# Patient Record
Sex: Male | Born: 1965 | Race: White | Hispanic: No | Marital: Single | Smoking: Current some day smoker
Health system: Southern US, Community
[De-identification: ages and names within clinical notes are randomized; demographics above are authoritative.]

## PROBLEM LIST (undated history)

## (undated) DIAGNOSIS — M1990 Unspecified osteoarthritis, unspecified site: Secondary | ICD-10-CM

## (undated) DIAGNOSIS — I10 Essential (primary) hypertension: Secondary | ICD-10-CM

## (undated) DIAGNOSIS — F319 Bipolar disorder, unspecified: Secondary | ICD-10-CM

## (undated) DIAGNOSIS — K219 Gastro-esophageal reflux disease without esophagitis: Secondary | ICD-10-CM

## (undated) DIAGNOSIS — F32A Depression, unspecified: Secondary | ICD-10-CM

## (undated) DIAGNOSIS — H269 Unspecified cataract: Secondary | ICD-10-CM

## (undated) DIAGNOSIS — F329 Major depressive disorder, single episode, unspecified: Secondary | ICD-10-CM

## (undated) DIAGNOSIS — F4310 Post-traumatic stress disorder, unspecified: Secondary | ICD-10-CM

## (undated) DIAGNOSIS — T8859XA Other complications of anesthesia, initial encounter: Secondary | ICD-10-CM

## (undated) DIAGNOSIS — T4145XA Adverse effect of unspecified anesthetic, initial encounter: Secondary | ICD-10-CM

## (undated) HISTORY — PX: FOOT SURGERY: SHX648

---

## 2001-07-23 ENCOUNTER — Emergency Department (HOSPITAL_COMMUNITY): Admission: AC | Admit: 2001-07-23 | Discharge: 2001-07-23 | Payer: Self-pay

## 2004-02-13 ENCOUNTER — Emergency Department (HOSPITAL_COMMUNITY): Admission: EM | Admit: 2004-02-13 | Discharge: 2004-02-13 | Payer: Self-pay | Admitting: Emergency Medicine

## 2006-08-11 ENCOUNTER — Ambulatory Visit: Payer: Self-pay | Admitting: Orthopedic Surgery

## 2006-11-04 HISTORY — PX: KNEE ARTHROPLASTY: SHX992

## 2006-12-01 ENCOUNTER — Inpatient Hospital Stay (HOSPITAL_COMMUNITY): Admission: RE | Admit: 2006-12-01 | Discharge: 2006-12-03 | Payer: Self-pay | Admitting: Orthopedic Surgery

## 2006-12-19 ENCOUNTER — Encounter (HOSPITAL_COMMUNITY): Admission: RE | Admit: 2006-12-19 | Discharge: 2007-01-18 | Payer: Self-pay | Admitting: Orthopedic Surgery

## 2007-01-26 ENCOUNTER — Encounter (HOSPITAL_COMMUNITY): Admission: RE | Admit: 2007-01-26 | Discharge: 2007-02-25 | Payer: Self-pay | Admitting: Orthopedic Surgery

## 2007-03-24 ENCOUNTER — Encounter (HOSPITAL_COMMUNITY): Admission: RE | Admit: 2007-03-24 | Discharge: 2007-04-23 | Payer: Self-pay | Admitting: Orthopedic Surgery

## 2007-11-05 HISTORY — PX: SPERMATOCELECTOMY: SHX2420

## 2007-12-10 IMAGING — CR DG KNEE 1-2V PORT*R*
2 series · 2 of 2 positions shown · non-contrast
Comparison: none

CLINICAL DATA: Post-op for right knee arthroplasty.
 PORTABLE RIGHT KNEE ? 2 VIEW:

[view not recorded (1 of 2)]
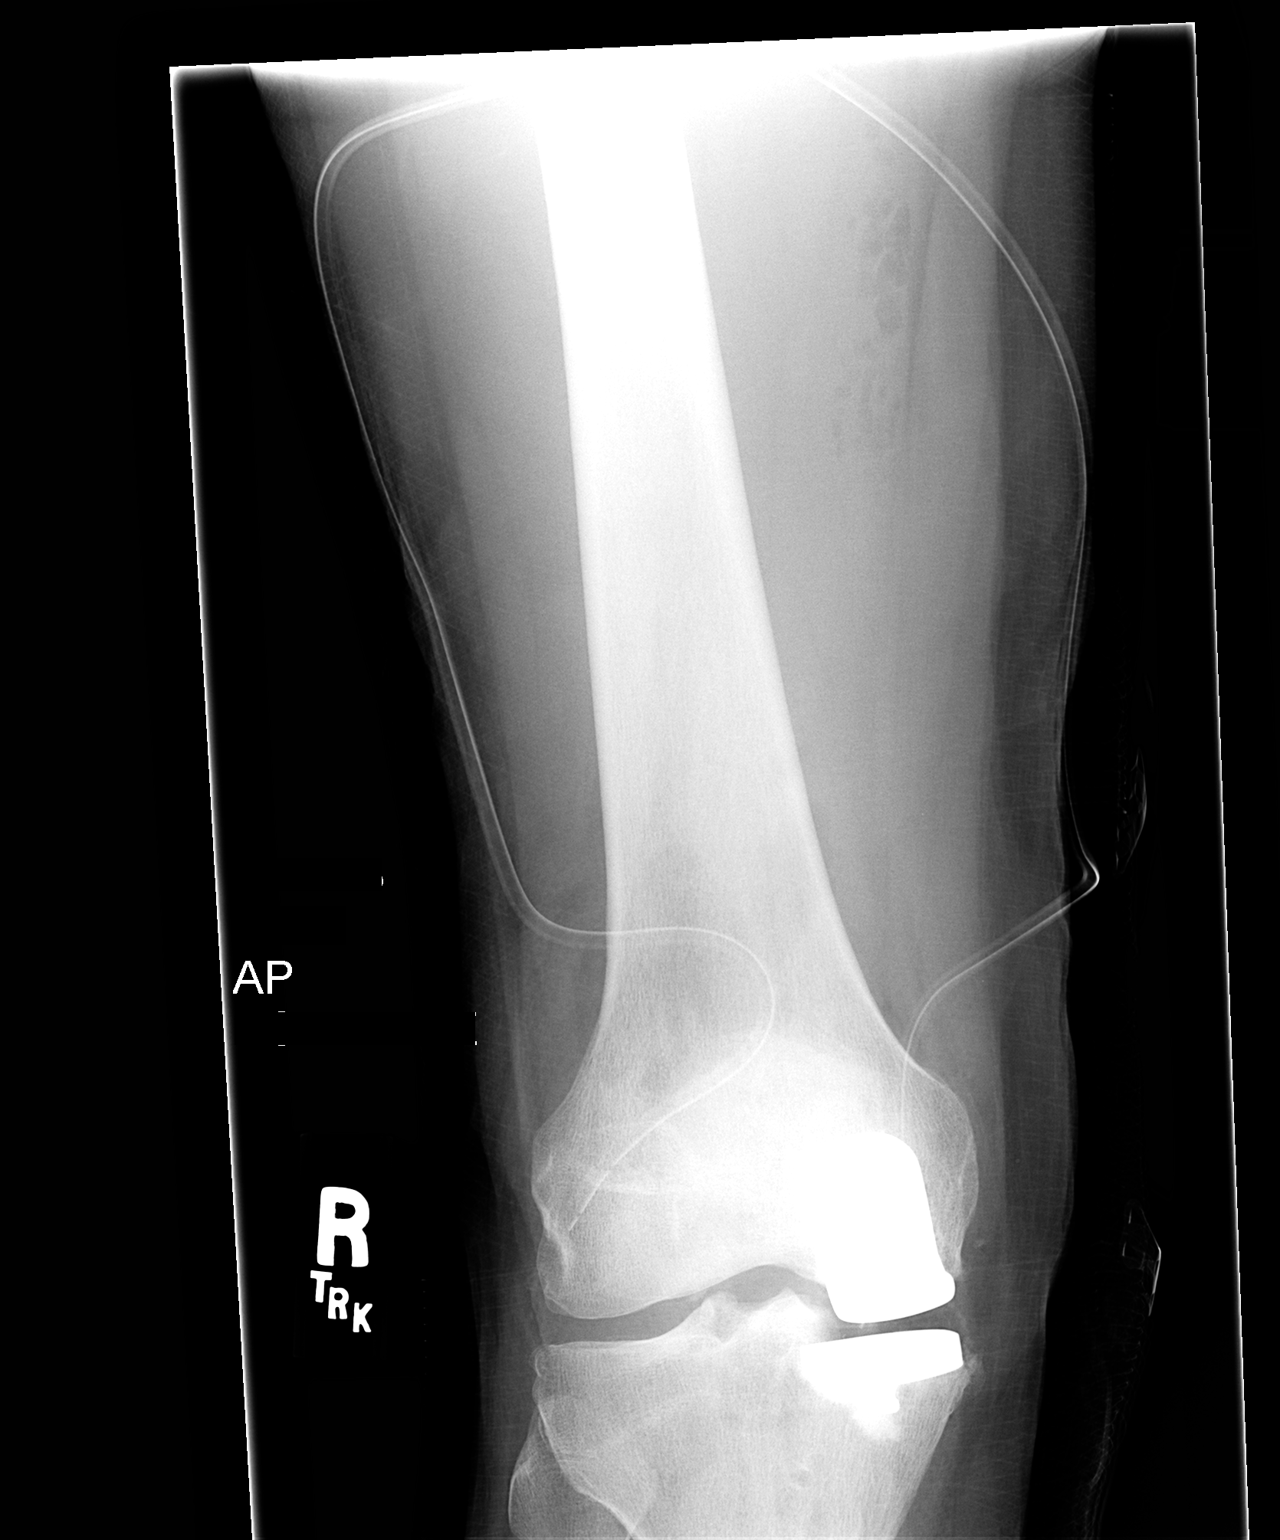

[view not recorded (2 of 2)]
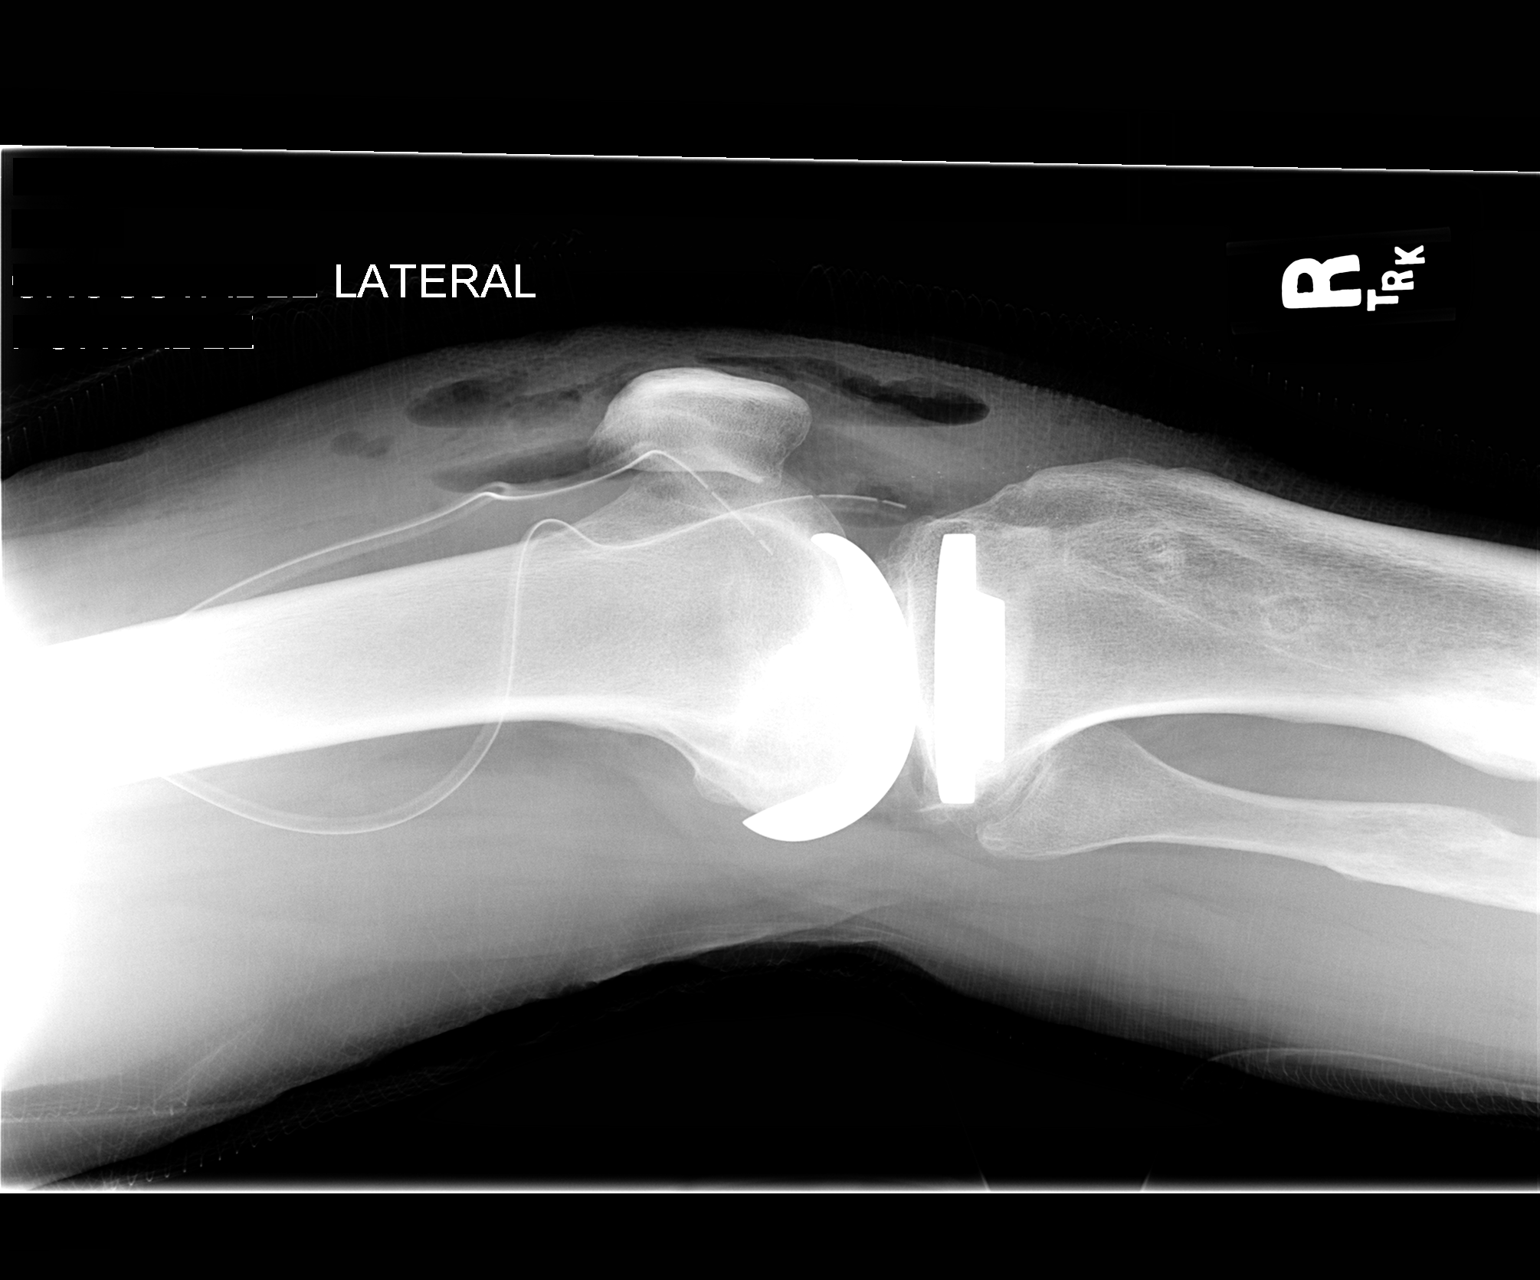

[2 of 2 positions shown; findings below may reference images not displayed]

FINDINGS: The patient is status post uniarthroplasty of the medial compartment with both prosthetic components in expected position.  There is no evidence of fracture or dislocation.  Surgical drains are seen in place and soft tissue gas is noted from recent surgery.
IMPRESSION: Expected postoperative appearance of medial compartment uniarthroplasty.  No evidence of fracture.

## 2008-08-23 ENCOUNTER — Ambulatory Visit (HOSPITAL_COMMUNITY): Admission: RE | Admit: 2008-08-23 | Discharge: 2008-08-23 | Payer: Self-pay | Admitting: Urology

## 2008-08-23 ENCOUNTER — Encounter (INDEPENDENT_AMBULATORY_CARE_PROVIDER_SITE_OTHER): Payer: Self-pay | Admitting: Urology

## 2011-03-19 NOTE — Op Note (Signed)
NAME:  Keith Garza, Keith Garza              ACCOUNT NO.:  [id number]   MEDICAL RECORD NO.:  [id number]          PATIENT TYPE:  AMB   LOCATION:  DAY                           FACILITY:  [facility]   PHYSICIAN:  Ky Barban, M.D.DATE OF BIRTH:  [birth year]   DATE OF PROCEDURE:  DATE OF DISCHARGE:                               OPERATIVE REPORT   PREOPERATIVE DIAGNOSIS:  Left spermatocele.   POSTOPERATIVE DIAGNOSIS:  Left spermatocele.   PROCEDURE:  Excision of left spermatocele.   ANESTHESIA:  General.   PROCEDURE:  The patient is under general anesthesia in supine position  area prepped and draped.  The spermatocele which is the size of a walnut  was held by the assistant.  Skin incision over it was made about an inch  long, carried down through the fascial layers which were divided with  the help of the cautery.  Spermatocele along with the testicle was  delivered through this incision with blunt dissection.  I was able to  dissect the spermatocele completely where it was attached to the area of  the epididymis and I could see the epididymis, started with lot of small  cysts, but it appeared like the area of the epididymis, but the vas  deferens I could not see it is separate from this whole area.  I  separated the vas deferens.  I just could not recognize, I thought it is  just tunica vaginalis which was rolled and feel like a cord, but it  started with those multiple cysts, also the larger cyst was not one,  there were smaller cysts along with it.  So, I had to excise all of them  along with this area which is attached to the upper pole of the  testicle.  It is the area where the epididymis was supposed to be, but I  cannot say 100% it was the epididymis.  So, I decided to remove it  because there are multiple cysts.  I am concerned that he will have a  recurrence of this spermatocele again.  After removing the specimen, the  hemostasis was completely obtained by cauterizing the  bleeders.  The  testicle was placed properly and the scrotal sac and the fascial layer  of the wound was irrigated with saline and fascial layers were closed  with continuous stitch of 3-0 Vicryl.  Skin was closed with 3-0 chromic  horizontal mattress stitches and Betadine ointment applied to the  incision.  A 0.25% Marcaine about 10 mL injected in the incision.  Sterile dressing applied.  The patient left the operating room in  satisfactory condition.      Ky Barban, M.D.  Electronically Signed     MIJ/MEDQ  D:  08/23/2008  T:  08/24/2008  Job:  [id number]

## 2011-03-19 NOTE — Consult Note (Signed)
NAME:  Keith Garza, Keith Garza              ACCOUNT NO.:  [id number]   MEDICAL RECORD NO.:  [id number]          PATIENT TYPE:  AMB   LOCATION:  DAY                           FACILITY:  [facility]   PHYSICIAN:  Ky Barban, M.D.DATE OF BIRTH:  1966/05/13   DATE OF CONSULTATION:  DATE OF DISCHARGE:                                 CONSULTATION   CHIEF COMPLAINT:  Painful left testicular swelling.   HISTORY:  A 45 year old gentleman who was referred over to me by Dr.  Nobie Putnam.  The patient is having swelling in his left testicle which  further workup showed there is spermatocele.  I have advised him  strongly to undergo further operative treatment.  He says it is  bothering him, it is uncomfortable via the jeans, and he wanted to get  it removed.  He said that he has had the swelling for 10-12 years and  has been to the hospital several times.  Swelling has gradually  increased to this present size, so he wanted to get it removed.  It  interferes with his self-esteem also.   PAST MEDICAL HISTORY:  Right knee replacement.  No other surgeries or  any significant medical problem like diabetes or hypertension.   REVIEW OF SYSTEMS:  Unremarkable.   PHYSICAL EXAMINATION:  GENERAL:  Well-nourished, well-developed male.  VITAL SIGNS:  Blood pressure 143/96, temperature 98.6.  CENTRAL NERVOUS SYSTEM:  No gross neurological deficit.  HEENT:  Negative.  CHEST:  Symmetrical.  HEART:  Regular sinus rhythm, no murmur.  ABDOMEN:  Soft, flat.  Liver, spleen, kidneys not palpable.  No CVA  tenderness.  EXTERNAL GENITALIA:  Circumcised, meatus adequate.  Testicles are  normal.  Left spermatocele which transilluminates.   IMPRESSION:  Left spermatocele.   PLAN:  Left spermatocelectomy under anesthesia as outpatient.  I have  told them that there is a possibility this might come back.  He  understands and wanted me to go ahead and proceed with it.      Ky Barban, M.D.  Electronically  Signed     MIJ/MEDQ  D:  08/22/2008  T:  08/22/2008  Job:  [id number]

## 2011-03-22 NOTE — Op Note (Signed)
Keith Garza, Keith Garza              ACCOUNT NO.:  [id number]   MEDICAL RECORD NO.:  [id number]          PATIENT TYPE:  INP   LOCATION:  2899                         FACILITY:  [facility]   PHYSICIAN:  Elana Alm. Thurston Garza, M.D. 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   PROCEDURE:  Right knee unicompartmental arthroplasty using DePuy  cemented unicompartmental device with #4 cemented femur, #3 cemented  tibia with 9.5 mm polyethylene tibial insert.   SURGEON:  Elana Alm. Thurston Garza, M.D.   ASSISTANT:  Julien Girt, P.A.   ANESTHESIA:  General.   OPERATIVE TIME:  1 hour and 50 minutes.   COMPLICATIONS:  None.   DESCRIPTION OF PROCEDURE:  Keith Garza was brought to operating room  on December 01, 2006 after a femoral nerve block was placed in holding  room by anesthesia.  He is placed on the operative table in supine  position.  After being placed under general anesthesia, he had a Foley  catheter placed under sterile conditions.  He received Ancef 1 gram IV  preoperatively for prophylaxis.  His right knee was examined.  Range of  motion from 0 to 135 degrees with mild varus deformity.  Knee stable to  ligamentous exam with normal patellar tracking.  Right leg was prepped  using sterile DuraPrep and draped using sterile technique.  Leg was  exsanguinated and thigh tourniquet elevated to 365 mm.  Initially  through a 10 cm longitudinal incision based over the patella, initial  exposure was made.  Underlying subcutaneous tissues were incised in line  with the skin incision.  A median arthrotomy was performed.  The  articular surfaces medially were inspected.  He had grade 4 changes in  the medial  femoral condyle, medial tibial plateau.  ACL, PCL was intact.  Patellofemoral joint articular cartilage was intact.  Lateral  compartment articular cartilage was intact.  Medial meniscus had  degenerative tearing and medial meniscectomy was performed.  Osteophytes  were removed from the femoral condyles and the tibial plateau.  After  this was done, then using the tibial external guide, a 6 mm cut was made  off the medial side, preserving the ACL and PCL attachments and a  sagittal saw was used to cut sagittally just medial to the ACL tibial  spine.  This bone was removed.  The proximal tibia medially was then  sized,  #3 was found to be the appropriate size,  #3 trial was placed,  found to be an excellent fit.  At this point the distal femur was sized.  #4 was found to be the appropriate size.  Initially the distal cutting  jig was placed in the appropriate rotation.  The distal cut was made and  then the chamfer cutter was placed on this distal femoral cut surface  and then the Chamfer cuts were made as well.  After  this was done then  the #4 trial was on the femur was placed in the appropriate amount of  rotation and then the keel cut was made.  After this was done with the  #4 trial femur placed and the #3 tibial trial in place, the knee was  taken through range of motion from 0 to 125 degrees with excellent  stability and excellent correction of his varus deformity.  At this  point the tibial baseplate was placed and the keel was cut in the  proximal tibia.  After this was done then the trial prosthesis were  removed.  The wound was irrigated with saline and then the actual  prosthesis was first placed in position before cementing the tibia and  the femur which gave excellent sizing and excellent fit.  The actual  prosthesis were then removed and then again the wound irrigated and then  the #3 tibial baseplate with cement backing was hammered into position  with an excellent fit with  excess cement being removed from around the  edges.  The polyethylene was then locked onto the tibial baseplate.  The  #4 femoral component with cement backing was hammered into position also  with an excellent fit with excess cement being removed from around the  edges.  At this point the knee was brought through a range of motion  from 0 to 125 degrees with excellent stability and excellent correction  of his varus deformity.  After the cement hardened, patellofemoral  tracking was again evaluated.  This was found be normal.  At this point  it was felt that all components were excellent size, fit and stability.  The wound was further irrigated.  Tourniquet was released.  Hemostasis  obtained with cautery.  The arthrotomy was then closed with #1 Ethibond  suture over two medium Hemovac drains.  Subcutaneous tissues closed with  0 and 2-0 Vicryl.  Subcuticular layer closed with 4-0 Monocryl.  Sterile  dressings were applied.  Long-leg splint applied and then the patient  awakened and taken to recovery room in stable condition.  Needle, sponge  counts correct x2 at the end of the case.      Keith Garza, M.D.  Electronically Signed     RAW/MEDQ  D:  12/01/2006  T:  12/01/2006  Job:  [id number]

## 2011-08-05 LAB — DIFFERENTIAL
Basophils Relative: 1
Eosinophils Absolute: 0.2
Eosinophils Relative: 3
Lymphocytes Relative: 30
Neutrophils Relative %: 55

## 2011-08-05 LAB — URINALYSIS, ROUTINE W REFLEX MICROSCOPIC
Ketones, ur: NEGATIVE
Nitrite: NEGATIVE
Specific Gravity, Urine: 1.01
Urobilinogen, UA: 0.2

## 2011-08-05 LAB — CBC
HCT: 42.9
Hemoglobin: 14.3
MCHC: 33.4
MCV: 89
Platelets: 216
RDW: 14
WBC: 5.5

## 2011-08-05 LAB — BASIC METABOLIC PANEL
BUN: 9
Calcium: 9.2
Creatinine, Ser: 0.73
GFR calc Af Amer: >60
GFR calc non Af Amer: >60

## 2013-04-02 ENCOUNTER — Encounter (HOSPITAL_COMMUNITY): Payer: Self-pay | Admitting: *Deleted

## 2013-04-02 ENCOUNTER — Emergency Department (HOSPITAL_COMMUNITY)
Admission: EM | Admit: 2013-04-02 | Discharge: 2013-04-02 | Disposition: A | Discharge status: Home / Self Care | Payer: No Typology Code available for payment source | Attending: Emergency Medicine | Admitting: Emergency Medicine

## 2013-04-02 ENCOUNTER — Emergency Department (HOSPITAL_COMMUNITY): Payer: No Typology Code available for payment source

## 2013-04-02 DIAGNOSIS — Z96659 Presence of unspecified artificial knee joint: Secondary | ICD-10-CM | POA: Insufficient documentation

## 2013-04-02 DIAGNOSIS — F17200 Nicotine dependence, unspecified, uncomplicated: Secondary | ICD-10-CM | POA: Insufficient documentation

## 2013-04-02 DIAGNOSIS — IMO0002 Reserved for concepts with insufficient information to code with codable children: Secondary | ICD-10-CM | POA: Insufficient documentation

## 2013-04-02 DIAGNOSIS — Z79899 Other long term (current) drug therapy: Secondary | ICD-10-CM | POA: Insufficient documentation

## 2013-04-02 DIAGNOSIS — S8000XA Contusion of unspecified knee, initial encounter: Secondary | ICD-10-CM | POA: Insufficient documentation

## 2013-04-02 DIAGNOSIS — S0990XA Unspecified injury of head, initial encounter: Secondary | ICD-10-CM | POA: Insufficient documentation

## 2013-04-02 DIAGNOSIS — Z8659 Personal history of other mental and behavioral disorders: Secondary | ICD-10-CM | POA: Insufficient documentation

## 2013-04-02 DIAGNOSIS — Y92410 Unspecified street and highway as the place of occurrence of the external cause: Secondary | ICD-10-CM | POA: Insufficient documentation

## 2013-04-02 DIAGNOSIS — Z87828 Personal history of other (healed) physical injury and trauma: Secondary | ICD-10-CM | POA: Insufficient documentation

## 2013-04-02 DIAGNOSIS — S79919A Unspecified injury of unspecified hip, initial encounter: Secondary | ICD-10-CM | POA: Insufficient documentation

## 2013-04-02 DIAGNOSIS — Y9389 Activity, other specified: Secondary | ICD-10-CM | POA: Insufficient documentation

## 2013-04-02 DIAGNOSIS — Z9889 Other specified postprocedural states: Secondary | ICD-10-CM | POA: Insufficient documentation

## 2013-04-02 DIAGNOSIS — I10 Essential (primary) hypertension: Secondary | ICD-10-CM | POA: Insufficient documentation

## 2013-04-02 DIAGNOSIS — S8001XA Contusion of right knee, initial encounter: Secondary | ICD-10-CM

## 2013-04-02 HISTORY — DX: Essential (primary) hypertension: I10

## 2013-04-02 HISTORY — DX: Depression, unspecified: F32.A

## 2013-04-02 HISTORY — DX: Major depressive disorder, single episode, unspecified: F32.9

## 2013-04-02 MED ORDER — CYCLOBENZAPRINE HCL 10 MG PO TABS: 10.0000 mg | ORAL_TABLET | Freq: Two times a day (BID) | ORAL | Status: DC | PRN

## 2013-04-02 MED ORDER — OXYCODONE-ACETAMINOPHEN 5-325 MG PO TABS
2.0000 | ORAL_TABLET | Freq: Once | ORAL | Status: AC
  Administered 2013-04-02: 2 via ORAL
  Filled 2013-04-02: qty 2

## 2013-04-02 MED ORDER — NAPROXEN 500 MG PO TABS: 500.0000 mg | ORAL_TABLET | Freq: Two times a day (BID) | ORAL | Status: DC

## 2013-04-02 NOTE — ED Notes (Signed)
Pt removed from backboard as per protocol.
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
## 2015-02-03 ENCOUNTER — Encounter (HOSPITAL_COMMUNITY): Payer: Self-pay

## 2015-02-03 ENCOUNTER — Emergency Department (HOSPITAL_COMMUNITY): Payer: Non-veteran care

## 2015-02-03 ENCOUNTER — Inpatient Hospital Stay (HOSPITAL_COMMUNITY)
Admission: EM | Admit: 2015-02-03 | Discharge: 2015-02-06 | DRG: 554 | Disposition: A | Discharge status: Home / Self Care | Payer: Non-veteran care | Attending: Family Medicine | Admitting: Family Medicine

## 2015-02-03 DIAGNOSIS — F17210 Nicotine dependence, cigarettes, uncomplicated: Secondary | ICD-10-CM | POA: Diagnosis present

## 2015-02-03 DIAGNOSIS — M10071 Idiopathic gout, right ankle and foot: Secondary | ICD-10-CM | POA: Diagnosis not present

## 2015-02-03 DIAGNOSIS — R52 Pain, unspecified: Secondary | ICD-10-CM | POA: Diagnosis not present

## 2015-02-03 DIAGNOSIS — M10072 Idiopathic gout, left ankle and foot: Secondary | ICD-10-CM | POA: Diagnosis present

## 2015-02-03 DIAGNOSIS — F329 Major depressive disorder, single episode, unspecified: Secondary | ICD-10-CM | POA: Diagnosis present

## 2015-02-03 DIAGNOSIS — L03116 Cellulitis of left lower limb: Secondary | ICD-10-CM | POA: Diagnosis present

## 2015-02-03 DIAGNOSIS — F32A Depression, unspecified: Secondary | ICD-10-CM | POA: Insufficient documentation

## 2015-02-03 DIAGNOSIS — R9389 Abnormal findings on diagnostic imaging of other specified body structures: Secondary | ICD-10-CM

## 2015-02-03 DIAGNOSIS — I10 Essential (primary) hypertension: Secondary | ICD-10-CM | POA: Diagnosis present

## 2015-02-03 DIAGNOSIS — M109 Gout, unspecified: Secondary | ICD-10-CM

## 2015-02-03 LAB — BASIC METABOLIC PANEL
ANION GAP: 12 (ref 5–15)
BUN: 14 mg/dL (ref 6–23)
CALCIUM: 9.3 mg/dL (ref 8.4–10.5)
CHLORIDE: 101 mmol/L (ref 96–112)
CO2: 28 mmol/L (ref 19–32)
CREATININE: 0.97 mg/dL (ref 0.50–1.35)
GLUCOSE: 102 mg/dL — AB (ref 70–99)
POTASSIUM: 3.5 mmol/L (ref 3.5–5.1)
SODIUM: 141 mmol/L (ref 135–145)

## 2015-02-03 LAB — CBC WITH DIFFERENTIAL/PLATELET
BASOS ABS: 0 10*3/uL (ref 0.0–0.1)
BASOS PCT: 0 % (ref 0–1)
Eosinophils Absolute: 0.1 10*3/uL (ref 0.0–0.7)
Eosinophils Relative: 1 % (ref 0–5)
HEMATOCRIT: 38.9 % — AB (ref 39.0–52.0)
Hemoglobin: 12.6 g/dL — ABNORMAL LOW (ref 13.0–17.0)
Lymphocytes Relative: 14 % (ref 12–46)
Lymphs Abs: 1.7 10*3/uL (ref 0.7–4.0)
MCH: 27.6 pg (ref 26.0–34.0)
MCHC: 32.4 g/dL (ref 30.0–36.0)
MCV: 85.3 fL (ref 78.0–100.0)
MONO ABS: 1.3 10*3/uL — AB (ref 0.1–1.0)
Monocytes Relative: 11 % (ref 3–12)
NEUTROS ABS: 8.9 10*3/uL — AB (ref 1.7–7.7)
Neutrophils Relative %: 74 % (ref 43–77)
PLATELETS: 215 10*3/uL (ref 150–400)
RBC: 4.56 MIL/uL (ref 4.22–5.81)
RDW: 14.1 % (ref 11.5–15.5)
WBC: 12 10*3/uL — ABNORMAL HIGH (ref 4.0–10.5)

## 2015-02-03 LAB — LACTIC ACID, PLASMA
LACTIC ACID, VENOUS: 1.4 mmol/L (ref 0.5–2.0)
Lactic Acid, Venous: 1.2 mmol/L (ref 0.5–2.0)

## 2015-02-03 LAB — URIC ACID: URIC ACID, SERUM: 9.4 mg/dL — AB (ref 4.0–7.8)

## 2015-02-03 MED ORDER — LORATADINE 10 MG PO TABS
10.0000 mg | ORAL_TABLET | Freq: Every day | ORAL | Status: DC
  Administered 2015-02-03 – 2015-02-06 (×4): 10 mg via ORAL
  Filled 2015-02-03 (×4): qty 1

## 2015-02-03 MED ORDER — ACETAMINOPHEN 325 MG PO TABS
650.0000 mg | ORAL_TABLET | Freq: Four times a day (QID) | ORAL | Status: DC | PRN
  Administered 2015-02-03: 650 mg via ORAL
  Filled 2015-02-03: qty 2

## 2015-02-03 MED ORDER — ALUM & MAG HYDROXIDE-SIMETH 200-200-20 MG/5ML PO SUSP
30.0000 mL | Freq: Four times a day (QID) | ORAL | Status: DC | PRN
  Administered 2015-02-04 – 2015-02-05 (×4): 30 mL via ORAL
  Filled 2015-02-03 (×4): qty 30

## 2015-02-03 MED ORDER — ENOXAPARIN SODIUM 40 MG/0.4ML ~~LOC~~ SOLN: 40.0000 mg | SUBCUTANEOUS | Status: DC

## 2015-02-03 MED ORDER — CEFAZOLIN SODIUM 1-5 GM-% IV SOLN
1.0000 g | Freq: Three times a day (TID) | INTRAVENOUS | Status: DC
  Administered 2015-02-04 – 2015-02-05 (×4): 1 g via INTRAVENOUS
  Filled 2015-02-03 (×8): qty 50

## 2015-02-03 MED ORDER — VANCOMYCIN HCL IN DEXTROSE 1-5 GM/200ML-% IV SOLN
1000.0000 mg | Freq: Once | INTRAVENOUS | Status: AC
  Administered 2015-02-03: 1000 mg via INTRAVENOUS
  Filled 2015-02-03: qty 200

## 2015-02-03 MED ORDER — MORPHINE SULFATE 4 MG/ML IJ SOLN
4.0000 mg | INTRAMUSCULAR | Status: AC | PRN
  Administered 2015-02-03 (×2): 4 mg via INTRAVENOUS
  Filled 2015-02-03: qty 1

## 2015-02-03 MED ORDER — DOCUSATE SODIUM 100 MG PO CAPS
100.0000 mg | ORAL_CAPSULE | Freq: Two times a day (BID) | ORAL | Status: DC
  Administered 2015-02-03 – 2015-02-06 (×5): 100 mg via ORAL
  Filled 2015-02-03 (×6): qty 1

## 2015-02-03 MED ORDER — GABAPENTIN 300 MG PO CAPS
300.0000 mg | ORAL_CAPSULE | Freq: Every day | ORAL | Status: DC
  Administered 2015-02-03 – 2015-02-05 (×3): 300 mg via ORAL
  Filled 2015-02-03 (×3): qty 1

## 2015-02-03 MED ORDER — MORPHINE SULFATE 2 MG/ML IJ SOLN
2.0000 mg | INTRAMUSCULAR | Status: DC | PRN
  Administered 2015-02-03 – 2015-02-05 (×10): 2 mg via INTRAVENOUS
  Filled 2015-02-03 (×10): qty 1

## 2015-02-03 MED ORDER — DIVALPROEX SODIUM 250 MG PO DR TAB
500.0000 mg | DELAYED_RELEASE_TABLET | Freq: Every day | ORAL | Status: DC
  Administered 2015-02-03 – 2015-02-06 (×4): 500 mg via ORAL
  Filled 2015-02-03 (×4): qty 2

## 2015-02-03 MED ORDER — ASPIRIN 81 MG PO CHEW
81.0000 mg | CHEWABLE_TABLET | Freq: Every day | ORAL | Status: DC
  Administered 2015-02-04 – 2015-02-06 (×3): 81 mg via ORAL
  Filled 2015-02-03 (×3): qty 1

## 2015-02-03 MED ORDER — LISINOPRIL 10 MG PO TABS
20.0000 mg | ORAL_TABLET | Freq: Every day | ORAL | Status: DC
  Administered 2015-02-04: 20 mg via ORAL
  Filled 2015-02-03: qty 2

## 2015-02-03 MED ORDER — LISINOPRIL-HYDROCHLOROTHIAZIDE 20-25 MG PO TABS: 1.0000 | ORAL_TABLET | Freq: Every day | ORAL | Status: DC

## 2015-02-03 MED ORDER — PANTOPRAZOLE SODIUM 40 MG PO TBEC: 40.0000 mg | DELAYED_RELEASE_TABLET | Freq: Every day | ORAL | Status: DC

## 2015-02-03 MED ORDER — PANTOPRAZOLE SODIUM 40 MG PO TBEC
40.0000 mg | DELAYED_RELEASE_TABLET | Freq: Every day | ORAL | Status: DC
  Administered 2015-02-04 – 2015-02-06 (×3): 40 mg via ORAL
  Filled 2015-02-03 (×3): qty 1

## 2015-02-03 MED ORDER — MIRTAZAPINE 30 MG PO TABS
30.0000 mg | ORAL_TABLET | Freq: Every day | ORAL | Status: DC
  Administered 2015-02-03 – 2015-02-05 (×3): 30 mg via ORAL
  Filled 2015-02-03 (×3): qty 1

## 2015-02-03 MED ORDER — ARIPIPRAZOLE 5 MG PO TABS
5.0000 mg | ORAL_TABLET | Freq: Every day | ORAL | Status: DC
  Administered 2015-02-04 – 2015-02-06 (×3): 5 mg via ORAL
  Filled 2015-02-03 (×5): qty 1

## 2015-02-03 MED ORDER — HYDROCHLOROTHIAZIDE 25 MG PO TABS
25.0000 mg | ORAL_TABLET | Freq: Every day | ORAL | Status: DC
  Administered 2015-02-04: 25 mg via ORAL
  Filled 2015-02-03: qty 1

## 2015-02-03 MED ORDER — CEFAZOLIN SODIUM 1-5 GM-% IV SOLN
INTRAVENOUS | Status: AC
  Filled 2015-02-03: qty 50

## 2015-02-03 MED ORDER — VANCOMYCIN HCL IN DEXTROSE 1-5 GM/200ML-% IV SOLN: 1000.0000 mg | Freq: Two times a day (BID) | INTRAVENOUS | Status: DC

## 2015-02-03 MED ORDER — ASPIRIN 81 MG PO CHEW: 81.0000 mg | CHEWABLE_TABLET | Freq: Every day | ORAL | Status: DC

## 2015-02-03 MED ORDER — MORPHINE SULFATE 4 MG/ML IJ SOLN
4.0000 mg | INTRAMUSCULAR | Status: DC | PRN
  Filled 2015-02-03: qty 1

## 2015-02-03 MED ORDER — ENOXAPARIN SODIUM 40 MG/0.4ML ~~LOC~~ SOLN
40.0000 mg | SUBCUTANEOUS | Status: DC
  Administered 2015-02-03 – 2015-02-04 (×2): 40 mg via SUBCUTANEOUS
  Filled 2015-02-03 (×3): qty 0.4

## 2015-02-03 MED ORDER — OXYCODONE HCL 5 MG PO TABS
10.0000 mg | ORAL_TABLET | ORAL | Status: DC | PRN
  Administered 2015-02-03 – 2015-02-06 (×12): 10 mg via ORAL
  Filled 2015-02-03 (×12): qty 2

## 2015-02-03 MED ORDER — ACETAMINOPHEN 650 MG RE SUPP
650.0000 mg | Freq: Four times a day (QID) | RECTAL | Status: DC | PRN
Start: 2015-02-03 — End: 2015-02-06

## 2015-02-03 NOTE — Progress Notes (Signed)
ANTIBIOTIC CONSULT NOTE  Pharmacy Consult for Vancomycin Indication: cellulitis  No Known Allergies  Patient Measurements: Height: 5\' 9"  (175.3 cm) Weight: 185 lb (83.915 kg) IBW/kg (Calculated) : 70.7  Vital Signs: Temp: 98.6 F (37 C) (04/01 1254) Temp Source: Oral (04/01 1254) BP: 126/92 mmHg (04/01 1630) Pulse Rate: 115 (04/01 1630) Intake/Output from previous day:   Intake/Output from this shift:    Labs:  Recent Labs  02/03/15 1417  WBC 12.0*  HGB 12.6*  PLT 215  CREATININE 0.97   Estimated Creatinine Clearance: 92.1 mL/min (by C-G formula based on Cr of 0.97). No results for input(s): VANCOTROUGH, VANCOPEAK, VANCORANDOM, GENTTROUGH, GENTPEAK, GENTRANDOM, TOBRATROUGH, TOBRAPEAK, TOBRARND, AMIKACINPEAK, AMIKACINTROU, AMIKACIN in the last 72 hours.   Microbiology: No results found for this or any previous visit (from the past 720 hour(s)).  Anti-infectives    Start     Dose/Rate Route Frequency Ordered Stop   02/03/15 1700  vancomycin (VANCOCIN) IVPB 1000 mg/200 mL premix     1,000 mg 200 mL/hr over 60 Minutes Intravenous  Once 02/03/15 1646        Assessment: 49 yo M who presents with pain of LLE that has become red & swollen.  MRI of foot report states osteomyelitis unlikely.   He is afebrile with mild leukocytosis.   Renal function is at patient's baseline.   Vancomycin 4/1>>  Goal of Therapy:  Vancomycin trough level 10-15 mcg/ml  Plan:  Vancomycin 1gm IV q12h Check Vancomycin trough at steady state Monitor renal function and cx data   Elson ClanLilliston, Tayli Buch Michelle 02/03/2015,4:47 PM

## 2015-02-03 NOTE — ED Notes (Signed)
Report given to Trula Orehristina, RN for room 309

## 2015-02-03 NOTE — H&P (Signed)
History and Physical  Keith Garza WUJ:[id number] DOB: 11/10/1965 DOA: 4/1/2016Dimple Garza  Referring physician: Dr Clarene DukeMcManus, ED physician PCP: No primary care provider on file. [facility]  Chief Complaint: Left foot pain  HPI: Keith CaseyRoger D Garza is a 49 y.o. male  With a history of hypertension, depression, mood disorder. The patient was seen for 2 weeks of worsening left foot pain with redness and warmth. There is no inciting injury. Pain radiating factors include IV pain medicine received in the emergency department, minimal improvement with ibuprofen at home. Rest improves pain. Ambulating worsens pain. Does admit to having chills or the past 1-2 days. Pain severe enough to limit the patient's ability to walk. Patient reports having history of gout that occurs in his MTP joints bilaterally, but the patient reports this redness covering the entire top of his left foot.  Did have some erythema in the right foot 2 weeks ago, but this resolved after a few days.   Review of Systems:   Pt complains of chills, nausea, ankle joint pain.  Pt denies any fevers, chest pain, shortness of breath, vomiting, abdominal pain, diarrhea, constipation, palpitations.  Review of systems are otherwise negative  Past Medical History  Diagnosis Date  . Hypertension   . Depression    Past Surgical History  Procedure Laterality Date  . Spermatocelectomy Left 2009  . Knee arthroplasty Right 2008    medial knee  . Foot surgery Left    Social History:  reports that he has been smoking Cigarettes.  He does not have any smokeless tobacco history on file. He reports that he drinks alcohol. He reports that he does not use illicit drugs. Patient lives at home & is able to participate in activities of daily living  No Known Allergies  No family history on file.  family history of hypertension, tobacco disorder  Prior to Admission medications   Medication Sig Start Date End Date Taking? Authorizing Provider  ARIPiprazole  (ABILIFY) 5 MG tablet Take 5 mg by mouth daily.   Yes Historical Provider, MD  aspirin 81 MG chewable tablet Chew 81 mg by mouth daily.   Yes Historical Provider, MD  cetirizine (ZYRTEC) 10 MG tablet Take 10 mg by mouth daily as needed for allergies.   Yes Historical Provider, MD  divalproex (DEPAKOTE) 500 MG DR tablet Take 500 mg by mouth daily.   Yes Historical Provider, MD  fish oil-omega-3 fatty acids 1000 MG capsule Take by mouth daily.   Yes Historical Provider, MD  flunisolide (NASALIDE) 25 MCG/ACT (0.025%) SOLN Inhale 2 sprays into the lungs daily.   Yes Historical Provider, MD  gabapentin (NEURONTIN) 300 MG capsule Take 300 mg by mouth at bedtime.   Yes Historical Provider, MD  lisinopril-hydrochlorothiazide (PRINZIDE,ZESTORETIC) 20-25 MG per tablet Take 1 tablet by mouth daily.   Yes Historical Provider, MD  mirtazapine (REMERON) 30 MG tablet Take 30 mg by mouth at bedtime.   Yes Historical Provider, MD  omeprazole (PRILOSEC) 20 MG capsule Take 20 mg by mouth 2 (two) times daily.   Yes Historical Provider, MD  cyclobenzaprine (FLEXERIL) 10 MG tablet Take 1 tablet (10 mg total) by mouth 2 (two) times daily as needed for muscle spasms. Patient not taking: Reported on 02/03/2015 04/02/13   Donnetta HutchingBrian Cook, MD  naproxen (NAPROSYN) 500 MG tablet Take 1 tablet (500 mg total) by mouth 2 (two) times daily. Patient not taking: Reported on 02/03/2015 04/02/13   Donnetta HutchingBrian Cook, MD    Physical Exam: BP 126/92 mmHg  Pulse 104  Temp(Src) 98.6 F (37 C) (Oral)  Resp 18  Ht  (1.753 m)  Wt 83.915 kg (185 lb)  BMI 27.31 kg/m2  SpO2 96%  General: Middle-age Caucasian male. Awake and alert and oriented x3. No acute cardiopulmonary distress.  Eyes: Pupils equal, round, reactive to light. Extraocular muscles are intact. Sclerae anicteric and noninjected.  ENT:  Moist mucosal membranes. No mucosal lesions.  Neck: Neck supple without lymphadenopathy. No carotid bruits. No masses palpated.  Cardiovascular:  Regular rate with normal S1-S2 sounds. No murmurs, rubs, gallops auscultated. No JVD.  Respiratory: Good respiratory effort with no wheezes, rales, rhonchi. Lungs clear to auscultation bilaterally.  Abdomen: Soft, nontender, nondistended. Active bowel sounds. No masses or hepatosplenomegaly  Skin: Dry, warm to touch. 2+ dorsalis pedis and radial pulses. The dorsum of his left foot is largely erythematous, warm, and tender. The tenderness includes a lot of the plantar surface of his left foot as well. There is sparing of the MTP joint.  Additionally, the second digit of his right foot appears a little swollen and is tender, but there is no erythema. There are no puncture wounds, scratches, abrasions of either foot Musculoskeletal: No calf or leg pain. All major joints not erythematous nontender.  Psychiatric: Intact judgment and insight.  Neurologic: No focal neurological deficits. Cranial nerves II through XII are grossly intact.           Labs on Admission:  Basic Metabolic Panel:  Recent Labs Lab 02/03/15 1417  NA 141  K 3.5  CL 101  CO2 28  GLUCOSE 102*  BUN 14  CREATININE 0.97  CALCIUM 9.3   Liver Function Tests: No results for input(s): AST, ALT, ALKPHOS, BILITOT, PROT, ALBUMIN in the last 168 hours. No results for input(s): LIPASE, AMYLASE in the last 168 hours. No results for input(s): AMMONIA in the last 168 hours. CBC:  Recent Labs Lab 02/03/15 1417  WBC 12.0*  NEUTROABS 8.9*  HGB 12.6*  HCT 38.9*  MCV 85.3  PLT 215   Radiological Exams on Admission: Mr Foot Left Wo Contrast  02/03/2015   CLINICAL DATA:  Followup abnormal foot x-rays.  EXAM: MRI OF THE LEFT FOREFOOT WITHOUT CONTRAST  TECHNIQUE: Multiplanar, multisequence MR imaging was performed. No intravenous contrast was administered.  COMPARISON:  Radiographs 02/03/2015  FINDINGS: Examination is limited by patient motion and lack of IV contrast.  There is an erosive process involving the proximal aspect of the  proximal phalanx of the second toe as demonstrated on the plain films. There is bone loss and adjacent soft tissue abnormality. I do not see any definite findings involving the second metatarsal head. There appears to be chronic periosteal elevation/reaction and a distal fracture.  There is diffuse subcutaneous soft tissue swelling/ edema involving the forefoot and mild changes of myositis. I do not see a discrete fluid collection to suggest an abscess. No findings to suggest septic arthritis. I think it is unlikely the process in the second toe is osteomyelitis given the fact that there is minimal surrounding subcutaneous soft tissue swelling/ edema/inflammation.  There are moderate midfoot degenerative changes with possible erosions or subchondral cysts. This would raise the possibility of the process in the second toe being gout.  IMPRESSION: Erosive process involving the proximal phalanx of the second toe as demonstrated on the plain films. Findings could be due to an enchondroma or gout. Probable chronic stress related periosteal reaction and a suspected distal fracture. I think osteomyelitis is unlikely.  Moderate  midfoot degenerative changes with subchondral cysts or erosions.  Diffuse subcutaneous soft tissue swelling/ edema suggesting cellulitis. There is also mild diffuse midfoot myositis.   Electronically Signed   By: Rudie Meyer M.D.   On: 02/03/2015 16:13   Dg Foot Complete Left  02/03/2015   CLINICAL DATA:  Left foot pain and swelling for 1 week, no known injury  EXAM: LEFT FOOT - COMPLETE 3+ VIEW  COMPARISON:  None.  FINDINGS: Three views of the left foot submitted. There is a lytic lesion with cortical irregularity proximal aspect proximal phalanx second toe. Osteomyelitis cannot be excluded. There is cortical reaction and vague lucent line in distal aspect of the proximal phalanx second toe. Pathologic fracture cannot be excluded. Clinical correlation is necessary. Further correlation with MRI  may be warranted. Soft tissue swelling second toe.  IMPRESSION: There is a lytic lesion with cortical irregularity proximal aspect proximal phalanx second toe. Osteomyelitis cannot be excluded. There is cortical reaction and vague lucent line in distal aspect of the proximal phalanx second toe. Pathologic fracture cannot be excluded. Clinical correlation is necessary. Further correlation with MRI may be warranted. Soft tissue swelling second toe.   Electronically Signed   By: Natasha Mead M.D.   On: 02/03/2015 14:57   Dg Foot Complete Right  02/03/2015   CLINICAL DATA:  Right foot pain for 2 weeks, initial encounter  EXAM: RIGHT FOOT COMPLETE - 3+ VIEW  COMPARISON:  None.  FINDINGS: There is no evidence of fracture or dislocation. There is no evidence of arthropathy or other focal bone abnormality. Soft tissues are unremarkable.  IMPRESSION: No acute abnormality noted.   Electronically Signed   By: Alcide Clever M.D.   On: 02/03/2015 16:15    Assessment/Plan Present on Admission:  . Cellulitis of left foot  Once it was a left foot #2 hypertension #3 mood disorder #4 depression  We'll admit the patient for observation as the patient had chills and nausea. No evidence of osteomyelitis on MR The ER doctor has started vancomycin on the patient, however the patient does not have any risk factors for MRSA.  We'll change the patient to Ancef as the cellulitis is nonpurulent and the patient has no known drug allergies. My anticipation is that the patient's condition will improve overnight and that the patient will be able to be sent home in the morning - which I discussed with the patient Repeat CBC for the morning  DVT prophylaxis: Lovenox  Consultants: None  Code Status: Full code  Family Communication: Girlfriend in the room   Disposition Plan: Home following observation  Time spent: 50 minutes was spent with face-to-face time with patient with at least 50% with counseling and coordination of  care  Levie Heritage, DO [facility] Pager [phone]

## 2015-02-03 NOTE — ED Provider Notes (Signed)
CSN: [id number]     Arrival date & time 02/03/15  1252 History   First MD Initiated Contact with Patient 02/03/15 1359     Chief Complaint  Patient presents with  . Foot Pain      HPI Pt was seen at 1400. Per pt, c/o gradual onset and worsening of persistent left foot "pain" for the past 2 weeks. Pt states 2 weeks ago his "right foot hurt" but "that went away." Pt states his left foot then started to hurt, and became progressively "red" and "swollen." States he has been unable to walk today due to the pain. Denies hx of same, no fevers, no focal motor weakness, no tingling/numbness in extremities, no other areas of rash, no injury.     Past Medical History  Diagnosis Date  . Hypertension   . Depression    Past Surgical History  Procedure Laterality Date  . Spermatocelectomy Left 2009  . Knee arthroplasty Right 2008    medial knee  . Foot surgery Left     History  Substance Use Topics  . Smoking status: Current Some Day Smoker    Types: Cigarettes  . Smokeless tobacco: Not on file  . Alcohol Use: Yes     Comment: occ    Review of Systems ROS: Statement: All systems negative except as marked or noted in the HPI; Constitutional: Negative for fever and chills. ; ; Eyes: Negative for eye pain, redness and discharge. ; ; ENMT: Negative for ear pain, hoarseness, nasal congestion, sinus pressure and sore throat. ; ; Cardiovascular: Negative for chest pain, palpitations, diaphoresis, dyspnea and peripheral edema. ; ; Respiratory: Negative for cough, wheezing and stridor. ; ; Gastrointestinal: Negative for nausea, vomiting, diarrhea, abdominal pain, blood in stool, hematemesis, jaundice and rectal bleeding. . ; ; Genitourinary: Negative for dysuria, flank pain and hematuria. ; ; Musculoskeletal: Negative for back pain and neck pain. Negative for trauma.; ; Skin: +left foot pain and rash. Negative for pruritus, abrasions, blisters, bruising and skin lesion.; ; Neuro: Negative for headache,  lightheadedness and neck stiffness. Negative for weakness, altered level of consciousness , altered mental status, extremity weakness, paresthesias, involuntary movement, seizure and syncope.       Allergies  Review of patient's allergies indicates no known allergies.  Home Medications   Prior to Admission medications   Medication Sig Start Date End Date Taking? Authorizing Provider  ARIPiprazole (ABILIFY) 5 MG tablet Take 5 mg by mouth daily.   Yes Historical Provider, MD  aspirin 81 MG chewable tablet Chew 81 mg by mouth daily.   Yes Historical Provider, MD  cetirizine (ZYRTEC) 10 MG tablet Take 10 mg by mouth daily as needed for allergies.   Yes Historical Provider, MD  divalproex (DEPAKOTE) 500 MG DR tablet Take 500 mg by mouth daily.   Yes Historical Provider, MD  fish oil-omega-3 fatty acids 1000 MG capsule Take by mouth daily.   Yes Historical Provider, MD  flunisolide (NASALIDE) 25 MCG/ACT (0.025%) SOLN Inhale 2 sprays into the lungs daily.   Yes Historical Provider, MD  gabapentin (NEURONTIN) 300 MG capsule Take 300 mg by mouth at bedtime.   Yes Historical Provider, MD  lisinopril-hydrochlorothiazide (PRINZIDE,ZESTORETIC) 20-25 MG per tablet Take 1 tablet by mouth daily.   Yes Historical Provider, MD  mirtazapine (REMERON) 30 MG tablet Take 30 mg by mouth at bedtime.   Yes Historical Provider, MD  omeprazole (PRILOSEC) 20 MG capsule Take 20 mg by mouth 2 (two) times daily.   Yes  Historical Provider, MD  oxyCODONE-acetaminophen (PERCOCET/ROXICET) 5-325 MG per tablet Take 1 tablet by mouth every 8 (eight) hours as needed for pain.   Yes Historical Provider, MD  cyclobenzaprine (FLEXERIL) 10 MG tablet Take 1 tablet (10 mg total) by mouth 2 (two) times daily as needed for muscle spasms. Patient not taking: Reported on 02/03/2015 04/02/13   Donnetta Hutching, MD  naproxen (NAPROSYN) 500 MG tablet Take 1 tablet (500 mg total) by mouth 2 (two) times daily. Patient not taking: Reported on 02/03/2015  04/02/13   Donnetta Hutching, MD   BP 117/77 mmHg  Pulse 112  Temp(Src) 98.6 F (37 C) (Oral)  Resp 20  Ht  (1.753 m)  Wt 185 lb (83.915 kg)  BMI 27.31 kg/m2  SpO2 100% Physical Exam  1405: Physical examination:  Nursing notes reviewed; Vital signs and O2 SAT reviewed;  Constitutional: Well developed, Well nourished, Well hydrated, In no acute distress; Head:  Normocephalic, atraumatic; Eyes: EOMI, PERRL, No scleral icterus; ENMT: Mouth and pharynx normal, Mucous membranes moist; Neck: Supple, Full range of motion, No lymphadenopathy; Cardiovascular: Regular rate and rhythm, No murmur, rub, or gallop; Respiratory: Breath sounds clear & equal bilaterally, No rales, rhonchi, wheezes.  Speaking full sentences with ease, Normal respiratory effort/excursion; Chest: Nontender, Movement normal; Abdomen: Soft, Nontender, Nondistended, Normal bowel sounds; Genitourinary: No CVA tenderness; Extremities: Pulses normal, +left dorsal foot tenderness, edema, and erythema with faint streaking up left ankle. No ecchymosis, no open wounds, no deformity, no soft tissue crepitus. No calf tenderness, edema or asymmetry. Right foot NT and without edema, erythema, ecchymosis, deformity, or open wounds. Strong bilat pedal pulses, muscles compartments soft.; Neuro: AA&Ox3, Major CN grossly intact.  Speech clear. No gross focal motor or sensory deficits in extremities.; Skin: Color normal, Warm, Dry.   ED Course  Procedures .   EKG Interpretation None      MDM  MDM Reviewed: previous chart, nursing note and vitals Reviewed previous: labs Interpretation: labs, x-ray and MRI     Results for orders placed or performed during the hospital encounter of 02/03/15  Basic metabolic panel  Result Value Ref Range   Sodium 141 135 - 145 mmol/L   Potassium 3.5 3.5 - 5.1 mmol/L   Chloride 101 96 - 112 mmol/L   CO2 28 19 - 32 mmol/L   Glucose, Bld 102 (H) 70 - 99 mg/dL   BUN 14 6 - 23 mg/dL   Creatinine, Ser 1.61 0.50  - 1.35 mg/dL   Calcium 9.3 8.4 - 09.6 mg/dL   GFR calc non Af Amer >90 >90 mL/min   GFR calc Af Amer >90 >90 mL/min   Anion gap 12 5 - 15  CBC with Differential  Result Value Ref Range   WBC 12.0 (H) 4.0 - 10.5 K/uL   RBC 4.56 4.22 - 5.81 MIL/uL   Hemoglobin 12.6 (L) 13.0 - 17.0 g/dL   HCT 04.5 (L) 40.9 - 81.1 %   MCV 85.3 78.0 - 100.0 fL   MCH 27.6 26.0 - 34.0 pg   MCHC 32.4 30.0 - 36.0 g/dL   RDW 91.4 78.2 - 95.6 %   Platelets 215 150 - 400 K/uL   Neutrophils Relative % 74 43 - 77 %   Neutro Abs 8.9 (H) 1.7 - 7.7 K/uL   Lymphocytes Relative 14 12 - 46 %   Lymphs Abs 1.7 0.7 - 4.0 K/uL   Monocytes Relative 11 3 - 12 %   Monocytes Absolute 1.3 (H) 0.1 -  1.0 K/uL   Eosinophils Relative 1 0 - 5 %   Eosinophils Absolute 0.1 0.0 - 0.7 K/uL   Basophils Relative 0 0 - 1 %   Basophils Absolute 0.0 0.0 - 0.1 K/uL  Lactic acid, plasma  Result Value Ref Range   Lactic Acid, Venous 1.2 0.5 - 2.0 mmol/L   Mr Foot Left Wo Contrast 02/03/2015   CLINICAL DATA:  Followup abnormal foot x-rays.  EXAM: MRI OF THE LEFT FOREFOOT WITHOUT CONTRAST  TECHNIQUE: Multiplanar, multisequence MR imaging was performed. No intravenous contrast was administered.  COMPARISON:  Radiographs 02/03/2015  FINDINGS: Examination is limited by patient motion and lack of IV contrast.  There is an erosive process involving the proximal aspect of the proximal phalanx of the second toe as demonstrated on the plain films. There is bone loss and adjacent soft tissue abnormality. I do not see any definite findings involving the second metatarsal head. There appears to be chronic periosteal elevation/reaction and a distal fracture.  There is diffuse subcutaneous soft tissue swelling/ edema involving the forefoot and mild changes of myositis. I do not see a discrete fluid collection to suggest an abscess. No findings to suggest septic arthritis. I think it is unlikely the process in the second toe is osteomyelitis given the fact that  there is minimal surrounding subcutaneous soft tissue swelling/ edema/inflammation.  There are moderate midfoot degenerative changes with possible erosions or subchondral cysts. This would raise the possibility of the process in the second toe being gout.  IMPRESSION: Erosive process involving the proximal phalanx of the second toe as demonstrated on the plain films. Findings could be due to an enchondroma or gout. Probable chronic stress related periosteal reaction and a suspected distal fracture. I think osteomyelitis is unlikely.  Moderate midfoot degenerative changes with subchondral cysts or erosions.  Diffuse subcutaneous soft tissue swelling/ edema suggesting cellulitis. There is also mild diffuse midfoot myositis.   Electronically Signed   By: Rudie Meyer M.D.   On: 02/03/2015 16:13   Dg Foot Complete Left 02/03/2015   CLINICAL DATA:  Left foot pain and swelling for 1 week, no known injury  EXAM: LEFT FOOT - COMPLETE 3+ VIEW  COMPARISON:  None.  FINDINGS: Three views of the left foot submitted. There is a lytic lesion with cortical irregularity proximal aspect proximal phalanx second toe. Osteomyelitis cannot be excluded. There is cortical reaction and vague lucent line in distal aspect of the proximal phalanx second toe. Pathologic fracture cannot be excluded. Clinical correlation is necessary. Further correlation with MRI may be warranted. Soft tissue swelling second toe.  IMPRESSION: There is a lytic lesion with cortical irregularity proximal aspect proximal phalanx second toe. Osteomyelitis cannot be excluded. There is cortical reaction and vague lucent line in distal aspect of the proximal phalanx second toe. Pathologic fracture cannot be excluded. Clinical correlation is necessary. Further correlation with MRI may be warranted. Soft tissue swelling second toe.   Electronically Signed   By: Natasha Mead M.D.   On: 02/03/2015 14:57   Dg Foot Complete Right 02/03/2015   CLINICAL DATA:  Right foot pain  for 2 weeks, initial encounter  EXAM: RIGHT FOOT COMPLETE - 3+ VIEW  COMPARISON:  None.  FINDINGS: There is no evidence of fracture or dislocation. There is no evidence of arthropathy or other focal bone abnormality. Soft tissues are unremarkable.  IMPRESSION: No acute abnormality noted.   Electronically Signed   By: Alcide Clever M.D.   On: 02/03/2015 16:15  1645:  Will start IV vancomycin for cellulitis. Pt continues to request "more pain medicine" despite multiple doses IV. Will re-medicate. Dx and testing d/w pt and family.  Questions answered.  Verb understanding, agreeable to observation admit. T/C to [facility]. Adrian Blackwater, case discussed, including:  HPI, pertinent PM/SHx, VS/PE, dx testing, ED course and treatment:  Agreeable to come to ED for evaluation, requests to add uric acid level.     Samuel Jester, DO 02/06/15 2017

## 2015-02-03 NOTE — ED Notes (Signed)
Pt is complain of pain in left foot. States he has gout. Foot swollen and red

## 2015-02-03 NOTE — ED Notes (Addendum)
PT c/o swelling with pain and redness to left foot more than right x1 week with hx of gout. PT states he has been unable to ambulate d/t pain.

## 2015-02-04 DIAGNOSIS — R52 Pain, unspecified: Secondary | ICD-10-CM | POA: Diagnosis present

## 2015-02-04 DIAGNOSIS — F17210 Nicotine dependence, cigarettes, uncomplicated: Secondary | ICD-10-CM | POA: Diagnosis present

## 2015-02-04 DIAGNOSIS — M10071 Idiopathic gout, right ankle and foot: Secondary | ICD-10-CM | POA: Diagnosis present

## 2015-02-04 DIAGNOSIS — I10 Essential (primary) hypertension: Secondary | ICD-10-CM | POA: Diagnosis present

## 2015-02-04 DIAGNOSIS — M109 Gout, unspecified: Secondary | ICD-10-CM

## 2015-02-04 DIAGNOSIS — L03116 Cellulitis of left lower limb: Secondary | ICD-10-CM | POA: Diagnosis present

## 2015-02-04 DIAGNOSIS — F329 Major depressive disorder, single episode, unspecified: Secondary | ICD-10-CM | POA: Diagnosis present

## 2015-02-04 DIAGNOSIS — M10072 Idiopathic gout, left ankle and foot: Secondary | ICD-10-CM | POA: Diagnosis present

## 2015-02-04 LAB — CBC
HEMATOCRIT: 35.1 % — AB (ref 39.0–52.0)
Hemoglobin: 11.1 g/dL — ABNORMAL LOW (ref 13.0–17.0)
MCH: 27 pg (ref 26.0–34.0)
MCHC: 31.6 g/dL (ref 30.0–36.0)
MCV: 85.4 fL (ref 78.0–100.0)
Platelets: 206 10*3/uL (ref 150–400)
RBC: 4.11 MIL/uL — AB (ref 4.22–5.81)
RDW: 14.3 % (ref 11.5–15.5)
WBC: 12 10*3/uL — ABNORMAL HIGH (ref 4.0–10.5)

## 2015-02-04 MED ORDER — INDOMETHACIN 25 MG PO CAPS
50.0000 mg | ORAL_CAPSULE | Freq: Three times a day (TID) | ORAL | Status: DC
  Administered 2015-02-04 – 2015-02-06 (×6): 50 mg via ORAL
  Filled 2015-02-04 (×6): qty 2

## 2015-02-04 MED ORDER — COLCHICINE 0.6 MG PO TABS
1.2000 mg | ORAL_TABLET | Freq: Once | ORAL | Status: AC
  Administered 2015-02-04: 1.2 mg via ORAL
  Filled 2015-02-04: qty 2

## 2015-02-04 MED ORDER — COLCHICINE 0.6 MG PO TABS
0.6000 mg | ORAL_TABLET | Freq: Once | ORAL | Status: AC
  Administered 2015-02-04: 0.6 mg via ORAL
  Filled 2015-02-04: qty 1

## 2015-02-04 NOTE — Progress Notes (Signed)
Patient complains of chills. Patient had a fever last night of 101.20F Temp right now is 98.17F. Dr. Irene LimboGoodrich notified.

## 2015-02-04 NOTE — Progress Notes (Addendum)
PROGRESS NOTE  Keith Garza All NGE:[id number] DOB: 08/11/1966 DOA: 02/03/2015 PCP: [facility] provider [facility] file[facility]  Summary: 49 year old man with history of gout who presented with 2 week history of increasing left foot pain with redness and warmth, no injury, unrelieved with ibuprofen. Reported having some erythema of the right foot 2 weeks ago. Admitted for left foot cellulitis.  Assessment/Plan: 1. Bilateral foot pain with erythema and edema L>>R. Suspect gout by history and clinical findings. MRI suggested findings related to gout as well as acute cellulitis. Blood cultures pending. Fever could be related to gout. Lactic acid is normal, no evidence of severe infection or sepsis at this point. 2. Suspected gout. No joint to tap at this point. 3. HTN. Stable. 4. Depression, mood disorder on Abilify, divalproex 5. Tobacco dependence. Recommend cessation. 6. Alcohol use. Monitor for withdrawal.   Continue empiric antibiotics, follow-up blood cultures.   Treat empirically for gout  indomethacin (Indocin) 50 mg 3 times daily  low-dose colchicine (1.2 mg orally then 0.6 mg 1 hour later)  Stop HCTZ  Code Status: full code DVT prophylaxis: Lovenox Family Communication: none  Disposition Plan: homea  [name], MD  [facility]  Pager [phone] If 7PM-7AM, please contact night-coverage at [email or web address], password [email or web address] 02/04/2015, 2:04 PM  LOS: 0 days   Consultants:    Procedures:    Antibiotics:  Vancomycin 4/1  Ancef 4/1 >>  HPI/Subjective: Febrile.  Bilateral foot pain left greater than right.   Objective: Filed Vitals:   02/03/15 2355 02/04/15 0205 02/04/15 0441 02/04/15 1050  BP:   95/61   Pulse:   100   Temp: 101.2 F (38.4 C) 98.8 F (37.1 C) 99.3 F (37.4 C) 98.9 F (37.2 C)  TempSrc: Oral  Oral Oral  Resp:   20   Height:      Weight:      SpO2:   98%     Intake/Output Summary (Last 24 hours) at 02/04/15 1404 Last data filed at  02/04/15 0900  Gross per 24 hour  Intake    240 ml  Output    200 ml  Net     40 ml     Filed Weights   02/03/15 1254 02/03/15 1841  Weight: 83.915 kg (185 lb) 85.367 kg (188 lb 3.2 oz)    Exam:     Tm 101.6, vital signs stable, hypoxia General:  Appears calm and comfortable Cardiovascular: RRR, no m/r/g.  Respiratory: CTA bilaterally, no w/r/r. Normal respiratory effort. Skin: left foot with erythema and warmth, generalized edema without induration or fluctuance, toes uninvolved. Ankle and leg appears unremarkable. Right foot with similar but less pronounced edema and erythema, also of second toe. Psychiatric: grossly normal mood and affect, speech fluent and appropriate  New data reviewed:  WBC without change, 12.0  Pertinent data since admission:  Basic metabolic panel unremarkable  Uric acid 9.4  Lactic acid within normal limits  Left foot x-ray no acute abnormalities  MRI left foot: Erosive process proximal phalanx second toe, could be secondary to gout probable chronic stress-related periosteal reaction in the suspected distal fracture. Osteomyelitis thought to be unlikely. Diffuse subcutaneous soft tissue swelling and edema suggesting cellulitis.  Pending data:  Blood cultures  Scheduled Meds: . ARIPiprazole  5 mg Oral Daily  . aspirin  81 mg Oral Daily  .  ceFAZolin (ANCEF) IV  1 g Intravenous 3 times per day  . divalproex  500 mg Oral Daily  . docusate sodium  100 mg Oral BID  . enoxaparin (LOVENOX) injection  40 mg Subcutaneous Q24H  . gabapentin  300 mg Oral QHS  . lisinopril  20 mg Oral Daily   And  . hydrochlorothiazide  25 mg Oral Daily  . loratadine  10 mg Oral Daily  . mirtazapine  30 mg Oral QHS  . pantoprazole  40 mg Oral Daily   Continuous Infusions:   Principal Problem:   Acute gout Active Problems:   Cellulitis of left foot   Time spent 25 minutes

## 2015-02-04 NOTE — Progress Notes (Signed)
Utilization Review completed.  

## 2015-02-05 MED ORDER — CEPHALEXIN 500 MG PO CAPS
500.0000 mg | ORAL_CAPSULE | Freq: Three times a day (TID) | ORAL | Status: DC
  Administered 2015-02-05 – 2015-02-06 (×4): 500 mg via ORAL
  Filled 2015-02-05 (×4): qty 1

## 2015-02-05 NOTE — Progress Notes (Signed)
PROGRESS NOTE  Keith CaseyRoger D Garza [id number] DOB: 1966-05-09 DOA: 02/03/2015 PCP: [facility] primary care provider [facility] file. [facility]  Summary: 49 year old man with history of gout who presented with 2 week history of increasing left foot pain with redness and warmth, no injury, unrelieved with ibuprofen. Reported having some erythema of the right foot 2 weeks ago. Admitted for left foot cellulitis.  Assessment/Plan: 1. Bilateral foot pain with erythema and edema L>>R. Dramatically improved. MRI suggested findings related to gout as well as acute cellulitis. Blood cultures pending. Lactic acid normal, no evidence of severe infection or sepsis. 2. Acute gout bilateral feet. Improving with indomethacin and colchicine. No joint to tap at this point. 3. HTN. Stable off meds. Low normal BP, asymptomatic, well appearing. 4. Depression, mood disorder on Abilify, divalproex. Stable.  5. Tobacco dependence. Recommend cessation. 6. Alcohol use. Monitor for withdrawal.   Much improved today. Plan continue abx and indomethacin  No HCTZ on discharge  Anticipate discharge 4/4.  Code Status: full code DVT prophylaxis: Lovenox Family Communication: none  Disposition Plan: homea  [name], MD  [facility]  Pager [phone] If 7PM-7AM, please contact night-coverage at [email or web address], password [email or web address] 02/05/2015, 9:43 AM  LOS: 1 day   Consultants:    Procedures:    Antibiotics:  Vancomycin 4/1  Ancef 4/1 >> 4/3  Keflex 4/3 >> 4/7  HPI/Subjective: Much better, feet still painful but able to ambulate with walker.  Objective: Filed Vitals:   02/04/15 1050 02/04/15 1454 02/04/15 2213 02/05/15 0508  BP:  100/58 87/52 90/56   Pulse:  101 86 91  Temp: 98.9 F (37.2 C) 100.6 F (38.1 C) 98 F (36.7 C) 98.1 F (36.7 C)  TempSrc: Oral Oral Oral Oral  Resp:  20 20 20   Height:      Weight:      SpO2:  94% 92% 92%    Intake/Output Summary (Last 24 hours) at 02/05/15 0943 Last data filed  at 02/04/15 1859  Gross per 24 hour  Intake    480 ml  Output      0 ml  Net    480 ml     Filed Weights   02/03/15 1254 02/03/15 1841  Weight: 83.915 kg (185 lb) 85.367 kg (188 lb 3.2 oz)    Exam:     Tm 100.6, vital signs stable, no hypoxia General:  Appears calm and comfortable Cardiovascular: RRR, no m/r/g. Slight decrease left foot edema, slight increase right foot edema Respiratory: CTA bilaterally, no w/r/r. Normal respiratory effort. Skin: erythema bilateral feet has completely resolved Psychiatric: grossly normal mood and affect, speech fluent and appropriate  New data reviewed:  none  Pertinent data since admission:  Basic metabolic panel unremarkable  Uric acid 9.4  Lactic acid within normal limits  Left foot x-ray no acute abnormalities  MRI left foot: Erosive process proximal phalanx second toe, could be secondary to gout probable chronic stress-related periosteal reaction in the suspected distal fracture. Osteomyelitis thought to be unlikely. Diffuse subcutaneous soft tissue swelling and edema suggesting cellulitis.  Pending data:  Blood cultures  Scheduled Meds: . ARIPiprazole  5 mg Oral Daily  . aspirin  81 mg Oral Daily  .  ceFAZolin (ANCEF) IV  1 g Intravenous 3 times per day  . divalproex  500 mg Oral Daily  . docusate sodium  100 mg Oral BID  . enoxaparin (LOVENOX) injection  40 mg Subcutaneous Q24H  . gabapentin  300 mg Oral QHS  . indomethacin  50 mg  Oral TID WC  . loratadine  10 mg Oral Daily  . mirtazapine  30 mg Oral QHS  . pantoprazole  40 mg Oral Daily   Continuous Infusions:   Principal Problem:   Acute gout Active Problems:   Cellulitis of left foot   Time spent 15 minutes

## 2015-02-06 MED ORDER — COLCHICINE 0.6 MG PO TABS
0.6000 mg | ORAL_TABLET | Freq: Once | ORAL | Status: AC
  Administered 2015-02-06: 0.6 mg via ORAL
  Filled 2015-02-06: qty 1

## 2015-02-06 MED ORDER — INDOMETHACIN 50 MG PO CAPS: 50.0000 mg | ORAL_CAPSULE | Freq: Three times a day (TID) | ORAL | Status: DC

## 2015-02-06 MED ORDER — CEPHALEXIN 500 MG PO CAPS: 500.0000 mg | ORAL_CAPSULE | Freq: Three times a day (TID) | ORAL | Status: DC

## 2015-02-06 MED ORDER — TRAMADOL HCL 50 MG PO TABS: 50.0000 mg | ORAL_TABLET | Freq: Four times a day (QID) | ORAL | Status: DC | PRN

## 2015-02-06 NOTE — Discharge Summary (Addendum)
Physician Discharge Summary  Keith Garza JXB:[id number] DOB: June 22, 1966 DOA: 50/11/2014  PCP: No primary care provider on file. [place] [place] [facility]  Admit date: 02/03/2015 Discharge date: 02/06/2015  Recommendations for Outpatient Follow-up:  1. Resolution of presumed gout and cellulitis. 2. Hypertension. Blood pressure very well controlled without pharmacologic therapy. Medications discontinued as below. 3. Recommend smoking cessation. Abstinence from alcohol.   Follow-up Information    Follow up with Wallace Cullens, MD On 02/22/2015.   Specialty:  Family Medicine   Why:  3 pm   Contact information:   [facility] [address] [place] [address] [phone]      Discharge Diagnoses:  1. Bilateral acute gout in the feet 2. Left foot cellulitis, possible 3. Hypertension 4. Tobacco dependence  Discharge Condition: improved Disposition: home  Diet recommendation: regular but avoid organ meat, red meat, alcohol  Filed Weights   02/03/15 1254 02/03/15 1841  Weight: 83.915 kg (185 lb) 85.367 kg (188 lb 3.2 oz)    History of present illness:  49 year old man with history of gout who presented with 2 week history of increasing left foot pain with redness and warmth, no injury, unrelieved with ibuprofen. Reported having some erythema of the right foot 2 weeks ago. Admitted for left foot cellulitis.  Hospital Course:  Mr. Poch was started on empiric antibiotics. His history and clinical exam was highly suspicious for gout given bilateral foot pain and swelling, elevated uric acid and history of hydrochlorothiazide use and organ meat consumption. He responded rapidly to colchicine and indomethacin and is now able to ambulate. He was treated for possible cellulitis of the left foot although this is doubtful based on his clinical exam. Hospitalization was uncomplicated. Individual issues as below. He was counseled on diet to increase the risk of gout and given  literature on this. I explained the reason for discontinuing Natrecor thiazide. Blood pressure is very well controlled and therefore lisinopril was stopped as well.  1. Bilateral foot pain with erythema and edema L>>R. Resolving rapidly. Can walk now. MRI suggested findings related to gout as well as acute cellulitis. Blood cultures pending, no growth to date. Lactic acid normal, no evidence of severe infection or sepsis. 2. Acute gout bilateral feet. Much improved. No joint to tap. Continue indomethacin. 3. HTN. Stable off meds. Low normal BP, asymptomatic, well appearing. 4. Depression, mood disorder on Abilify, divalproex. Stable.  5. Tobacco dependence. Recommend cessation. 6. Alcohol use. No evidence of withdrawal.   Continue indomethacin with food, continue PPI  Counseled on diet--stop organ meats, limit red meat, no alcohol.   Stop HCTZ on discharge  Discharge Instructions   Current Discharge Medication List    START taking these medications   Details  cephALEXin (KEFLEX) 500 MG capsule Take 1 capsule (500 mg total) by mouth every 8 (eight) hours. Qty: 10 capsule, Refills: 0    indomethacin (INDOCIN) 50 MG capsule Take 1 capsule (50 mg total) by mouth 3 (three) times daily with meals. Qty: 21 capsule, Refills: 0    traMADol (ULTRAM) 50 MG tablet Take 1 tablet (50 mg total) by mouth every 6 (six) hours as needed for moderate pain. Qty: 15 tablet, Refills: 0      CONTINUE these medications which have NOT CHANGED   Details  ARIPiprazole (ABILIFY) 5 MG tablet Take 5 mg by mouth daily.    aspirin 81 MG chewable tablet Chew 81 mg by mouth daily.    cetirizine (ZYRTEC) 10 MG tablet Take 10 mg  by mouth daily as needed for allergies.    divalproex (DEPAKOTE) 500 MG DR tablet Take 500 mg by mouth daily.    fish oil-omega-3 fatty acids 1000 MG capsule Take by mouth daily.    flunisolide (NASALIDE) 25 MCG/ACT (0.025%) SOLN Inhale 2 sprays into the lungs daily.      gabapentin (NEURONTIN) 300 MG capsule Take 300 mg by mouth at bedtime.    mirtazapine (REMERON) 30 MG tablet Take 30 mg by mouth at bedtime.    omeprazole (PRILOSEC) 20 MG capsule Take 20 mg by mouth 2 (two) times daily.      STOP taking these medications     lisinopril-hydrochlorothiazide (PRINZIDE,ZESTORETIC) 20-25 MG per tablet      cyclobenzaprine (FLEXERIL) 10 MG tablet      naproxen (NAPROSYN) 500 MG tablet        No Known Allergies  The results of significant diagnostics from this hospitalization (including imaging, microbiology, ancillary and laboratory) are listed below for reference.    Significant Diagnostic Studies: Mr Foot Left Wo Contrast  02/03/2015   CLINICAL DATA:  Followup abnormal foot x-rays.  EXAM: MRI OF THE LEFT FOREFOOT WITHOUT CONTRAST  TECHNIQUE: Multiplanar, multisequence MR imaging was performed. No intravenous contrast was administered.  COMPARISON:  Radiographs 02/03/2015  FINDINGS: Examination is limited by patient motion and lack of IV contrast.  There is an erosive process involving the proximal aspect of the proximal phalanx of the second toe as demonstrated on the plain films. There is bone loss and adjacent soft tissue abnormality. I do not see any definite findings involving the second metatarsal head. There appears to be chronic periosteal elevation/reaction and a distal fracture.  There is diffuse subcutaneous soft tissue swelling/ edema involving the forefoot and mild changes of myositis. I do not see a discrete fluid collection to suggest an abscess. No findings to suggest septic arthritis. I think it is unlikely the process in the second toe is osteomyelitis given the fact that there is minimal surrounding subcutaneous soft tissue swelling/ edema/inflammation.  There are moderate midfoot degenerative changes with possible erosions or subchondral cysts. This would raise the possibility of the process in the second toe being gout.  IMPRESSION: Erosive  process involving the proximal phalanx of the second toe as demonstrated on the plain films. Findings could be due to an enchondroma or gout. Probable chronic stress related periosteal reaction and a suspected distal fracture. I think osteomyelitis is unlikely.  Moderate midfoot degenerative changes with subchondral cysts or erosions.  Diffuse subcutaneous soft tissue swelling/ edema suggesting cellulitis. There is also mild diffuse midfoot myositis.   Electronically Signed   By: Rudie Meyer M.D.   On: 02/03/2015 16:13   Dg Foot Complete Left  02/03/2015   CLINICAL DATA:  Left foot pain and swelling for 1 week, no known injury  EXAM: LEFT FOOT - COMPLETE 3+ VIEW  COMPARISON:  None.  FINDINGS: Three views of the left foot submitted. There is a lytic lesion with cortical irregularity proximal aspect proximal phalanx second toe. Osteomyelitis cannot be excluded. There is cortical reaction and vague lucent line in distal aspect of the proximal phalanx second toe. Pathologic fracture cannot be excluded. Clinical correlation is necessary. Further correlation with MRI may be warranted. Soft tissue swelling second toe.  IMPRESSION: There is a lytic lesion with cortical irregularity proximal aspect proximal phalanx second toe. Osteomyelitis cannot be excluded. There is cortical reaction and vague lucent line in distal aspect of the proximal phalanx second toe.  Pathologic fracture cannot be excluded. Clinical correlation is necessary. Further correlation with MRI may be warranted. Soft tissue swelling second toe.   Electronically Signed   By: Natasha MeadLiviu  Pop M.D.   On: 02/03/2015 14:57   Dg Foot Complete Right  02/03/2015   CLINICAL DATA:  Right foot pain for 2 weeks, initial encounter  EXAM: RIGHT FOOT COMPLETE - 3+ VIEW  COMPARISON:  None.  FINDINGS: There is no evidence of fracture or dislocation. There is no evidence of arthropathy or other focal bone abnormality. Soft tissues are unremarkable.  IMPRESSION: No acute  abnormality noted.   Electronically Signed   By: Alcide CleverMark  Lukens M.D.   On: 02/03/2015 16:15    Microbiology: Recent Results (from the past 240 hour(s))  Culture, blood (routine x 2)     Status: None (Preliminary result)   Collection Time: 02/04/15 12:25 PM  Result Value Ref Range Status   Specimen Description BLOOD LEFT ANTECUBITAL  Final   Special Requests BOTTLES DRAWN AEROBIC AND ANAEROBIC 6CC  Final   Culture NO GROWTH 2 DAYS  Final   Report Status PENDING  Incomplete  Culture, blood (routine x 2)     Status: None (Preliminary result)   Collection Time: 02/04/15 12:26 PM  Result Value Ref Range Status   Specimen Description BLOOD LEFT HAND  Final   Special Requests BOTTLES DRAWN AEROBIC ONLY 6CC  Final   Culture NO GROWTH 2 DAYS  Final   Report Status PENDING  Incomplete     Labs: Basic Metabolic Panel:  Recent Labs Lab 02/03/15 1417  NA 141  K 3.5  CL 101  CO2 28  GLUCOSE 102*  BUN 14  CREATININE 0.97  CALCIUM 9.3   CBC:  Recent Labs Lab 02/03/15 1417 02/04/15 0443  WBC 12.0* 12.0*  NEUTROABS 8.9*  --   HGB 12.6* 11.1*  HCT 38.9* 35.1*  MCV 85.3 85.4  PLT 215 206    Principal Problem:   Acute gout Active Problems:   Cellulitis of left foot   Time coordinating discharge: 35 minutes  Signed:  [name], MD [facility] 02/06/2015, 3:35 PM

## 2015-02-06 NOTE — Progress Notes (Signed)
PROGRESS NOTE  Dimple CaseyRoger D Whitmyer ZOX:[id number] DOB: 1966-07-10 DOA: 02/03/2015 PCP: No primary care provider on file. [place]  Summary: 49 year old man with history of gout who presented with 2 week history of increasing left foot pain with redness and warmth, no injury, unrelieved with ibuprofen. Reported having some erythema of the right foot 2 weeks ago. Admitted for left foot cellulitis.  Assessment/Plan: 1. Bilateral foot pain with erythema and edema L>>R. Resolving rapidly. Can walk now. MRI suggested findings related to gout as well as acute cellulitis. Blood cultures pending, no growth to date. Lactic acid normal, no evidence of severe infection or sepsis. 2. Acute gout bilateral feet. Much improved. No joint to tap. 3. HTN. Stable off meds. Low normal BP, asymptomatic, well appearing. 4. Depression, mood disorder on Abilify, divalproex. Stable.  5. Tobacco dependence. Recommend cessation. 6. Alcohol use. No evidence of withdrawal.   Much better  Continue indomethacin with food, continue PPI  Counseled on diet--stop organ meats, limit red meat, no alcohol.   Stop HCTZ on discharge  Code Status: full code DVT prophylaxis: Lovenox Family Communication: none  Disposition Plan: homea  Brendia Sacksaniel Vanissa Strength, MD  [facility]  Pager [phone] If 7PM-7AM, please contact night-coverage at [email or web address], password [email or web address] 02/06/2015, 1:06 PM  LOS: 2 days   Consultants:    Procedures:    Antibiotics:  Vancomycin 4/1  Ancef 4/1 >> 4/3  Keflex 4/3 >> 4/7  HPI/Subjective: Feeling better, can walk now, much less pain; some swelling right foot. Left foot dramatically improved.  Objective: Filed Vitals:   02/05/15 0508 02/05/15 1550 02/05/15 2113 02/06/15 0554  BP: 90/56 113/64 106/73 96/54  Pulse: 91 78 76 82  Temp: 98.1 F (36.7 C) 98 F (36.7 C) 97.8 F (36.6 C) 98 F (36.7 C)  TempSrc: Oral Oral Oral Oral  Resp: 20 20 20 20   Height:        Weight:      SpO2: 92% 98% 95% 92%    Intake/Output Summary (Last 24 hours) at 02/06/15 1306 Last data filed at 02/05/15 1900  Gross per 24 hour  Intake    240 ml  Output    200 ml  Net     40 ml     Filed Weights   02/03/15 1254 02/03/15 1841  Weight: 83.915 kg (185 lb) 85.367 kg (188 lb 3.2 oz)    Exam:     Afebrile 24 hours, vital signs stable, no hypoxia General:  Appears comfortable, calm. Cardiovascular: Regular rate and rhythm, no murmur, rub or gallop. No lower extremity edema. Respiratory: Clear to auscultation bilaterally, no wheezes, rales or rhonchi. Normal respiratory effort. Skin: erythema has resolved. Edema left foot dramatically improved, nontender to palpation. Mild right foot edema, no erythema. Mild tenderness. Psychiatric: grossly normal mood and affect, speech fluent and appropriate  New data reviewed:  none  Pertinent data since admission:  Basic metabolic panel unremarkable  Uric acid 9.4  Lactic acid within normal limits  Left foot x-ray no acute abnormalities  MRI left foot: Erosive process proximal phalanx second toe, could be secondary to gout probable chronic stress-related periosteal reaction in the suspected distal fracture. Osteomyelitis thought to be unlikely. Diffuse subcutaneous soft tissue swelling and edema suggesting cellulitis.  Pending data:  Blood cultures no growth 2 days  Scheduled Meds: . ARIPiprazole  5 mg Oral Daily  . aspirin  81 mg Oral Daily  . cephALEXin  500 mg Oral 3 times per day  . divalproex  500 mg Oral Daily  . docusate sodium  100 mg Oral BID  . enoxaparin (LOVENOX) injection  40 mg Subcutaneous Q24H  . gabapentin  300 mg Oral QHS  . indomethacin  50 mg Oral TID WC  . loratadine  10 mg Oral Daily  . mirtazapine  30 mg Oral QHS  . pantoprazole  40 mg Oral Daily   Continuous Infusions:   Principal Problem:   Acute gout Active Problems:   Cellulitis of left foot

## 2015-02-06 NOTE — Care Management Note (Addendum)
    Page 1 of 1   02/06/2015     4:10:52 PM CARE MANAGEMENT NOTE 02/06/2015  Patient:  Keith Garza,Keith Garza   Account Number:  [id number]  Date Initiated:  02/06/2015  Documentation initiated by:  Kathyrn SheriffHILDRESS,JESSICA  Subjective/Objective Assessment:   Pt admitted for cellulitis. Pt is from home with wife. Pt indepenent at baseline and has no DME's or HH services. [facility]. [facility] Pt also has Medicare part A.     Action/Plan:   Pt plans to dsicharge home with self care. [facility]. No other CM needs.   Anticipated DC Date:  02/06/2015   Anticipated DC Plan:  HOME/SELF CARE      DC Planning Services  CM consult      Choice offered to / List presented to:             Status of service:  Completed, signed off Medicare Important Message given?   (If response is "NO", the following Medicare IM given date fields will be blank) Date Medicare IM given:   Medicare IM given by:   Date Additional Medicare IM given:   Additional Medicare IM given by:    Discharge Disposition:  HOME/SELF CARE  Per UR Regulation:  Reviewed for med. necessity/level of care/duration of stay  If discussed at Long Length of Stay Meetings, dates discussed:    Comments:  02/06/2015 1600 Kathyrn SheriffJessica Childress, RN, MSN, CM

## 2015-02-06 NOTE — Progress Notes (Addendum)
Patient discharged home.  IV removed - WNL.  Reviewed medications and when to take them again.  Instructed when to call MD.  Follow up appointment in place.  Instructed on importance of taking meds as prescribed to prevent further gout flare and to avoid alcohol and red, organ meats.  Smoking cessation info and education given.  Verbalizes understanding.  No questions at this time.  Stable to DC home, [facility].  DC summary and H&P faxed to MD's office for follow up

## 2015-02-09 LAB — CULTURE, BLOOD (ROUTINE X 2)
Culture: NO GROWTH
Culture: NO GROWTH

## 2017-01-09 ENCOUNTER — Ambulatory Visit (INDEPENDENT_AMBULATORY_CARE_PROVIDER_SITE_OTHER): Payer: Self-pay | Admitting: Orthopaedic Surgery

## 2017-01-17 ENCOUNTER — Ambulatory Visit (INDEPENDENT_AMBULATORY_CARE_PROVIDER_SITE_OTHER): Payer: Self-pay | Admitting: Orthopaedic Surgery

## 2017-01-23 DIAGNOSIS — M1711 Unilateral primary osteoarthritis, right knee: Secondary | ICD-10-CM | POA: Insufficient documentation

## 2017-01-24 ENCOUNTER — Ambulatory Visit (INDEPENDENT_AMBULATORY_CARE_PROVIDER_SITE_OTHER): Payer: Non-veteran care

## 2017-01-24 ENCOUNTER — Ambulatory Visit (INDEPENDENT_AMBULATORY_CARE_PROVIDER_SITE_OTHER): Payer: Non-veteran care | Admitting: Orthopaedic Surgery

## 2017-01-24 ENCOUNTER — Encounter (INDEPENDENT_AMBULATORY_CARE_PROVIDER_SITE_OTHER): Payer: Self-pay

## 2017-01-24 ENCOUNTER — Encounter (INDEPENDENT_AMBULATORY_CARE_PROVIDER_SITE_OTHER): Payer: Self-pay | Admitting: Orthopaedic Surgery

## 2017-01-24 DIAGNOSIS — M1711 Unilateral primary osteoarthritis, right knee: Secondary | ICD-10-CM

## 2017-01-24 NOTE — Progress Notes (Signed)
Office Visit Note   Patient: Keith Garza           Date of Birth: February 08, 1966           MRN: [id number] Visit Date: 01/24/2017              Requested by: Bing Ree, [place] [address] PCP: No PCP Per Patient   Assessment & Plan: Visit Diagnoses:  1. Unilateral primary osteoarthritis, right knee     Plan: My impression is that the patient's degenerative joint disease has progressed in his other compartments. I do not see any evidence of loosening. I did draw labs today to rule out infection.  My recommendation is to convert the partial knee replacement to a total knee replacement. He understands the risks benefits alternatives to surgery including infection, stiffness, incomplete relief of pain, additional surgery, DVT. He understands and wishes to proceed. We will get him scheduled for surgery near future pending [facility] approval  Follow-Up Instructions: Return if symptoms worsen or fail to improve.   Orders:  Orders Placed This Encounter  Procedures  . XR KNEE 3 VIEW RIGHT  . XR Tibia/Fibula Right  . C-reactive protein  . Sed Rate (ESR)  . CBC With Differential   No orders of the defined types were placed in this encounter.     Procedures: No procedures performed   Clinical Data: No additional findings.   Subjective: Chief Complaint  Patient presents with  . Right Knee - Pain    Patient is a 51 year old gentleman who comes here today from the [place] for chronic right knee pain. He is status post right medial unicompartmental knee replacement by Dr. Noemi Chapel in 2008. He's having worsening pain over the last couple years. He smokes 6-7 cigarettes a day. He takes oxycodone and ibuprofen for chronic throbbing pain that is 9 out of 10. He endorses generalized knee pain with grinding and popping. He did have stiffness postoperatively which required a manipulation. He does not have any DVTs or infections postoperatively. He denies any constitutional  symptoms    Review of Systems  Constitutional: Negative.   All other systems reviewed and are negative.    Objective: Vital Signs: There were no vitals taken for this visit.  Physical Exam  Constitutional: He is oriented to person, place, and time. He appears well-developed and well-nourished.  HENT:  Head: Normocephalic and atraumatic.  Eyes: Pupils are equal, round, and reactive to light.  Neck: Neck supple.  Pulmonary/Chest: Effort normal.  Abdominal: Soft.  Musculoskeletal: Normal range of motion.  Neurological: He is alert and oriented to person, place, and time.  Skin: Skin is warm.  Psychiatric: He has a normal mood and affect. His behavior is normal. Judgment and thought content normal.  Nursing note and vitals reviewed.   Ortho Exam Right knee exam shows a well-healed midline surgical scar. There is no signs of infection or warmth or swelling or effusion. His range of motion is appropriate. He has generalized pain throughout the knee. Collaterals and cruciates are stable. Specialty Comments:  No specialty comments available.  Imaging: Xr Knee 3 View Right  Result Date: 01/24/2017 Status post medial compartment knee arthroplasty without any obvious signs of loosening. Lateral compartment degenerative joint disease  Xr Tibia/fibula Right  Result Date: 01/24/2017 Mild malunion of proximal tibial shaft fracture and fibula fracture    PMFS History: Patient Active Problem List   Diagnosis Date Noted  . Unilateral primary osteoarthritis, right knee  01/23/2017  . Acute gout 02/04/2015  . Hypertension 02/03/2015  . Depression 02/03/2015  . Cellulitis of left foot 02/03/2015   Past Medical History:  Diagnosis Date  . Depression   . Hypertension     No family history on file.  Past Surgical History:  Procedure Laterality Date  . FOOT SURGERY Left   . KNEE ARTHROPLASTY Right 2008   medial knee  . SPERMATOCELECTOMY Left 2009   Social History    Occupational History  . Not on file.   Social History Main Topics  . Smoking status: Current Some Day Smoker    Types: Cigarettes  . Smokeless tobacco: Never Used  . Alcohol use Yes     Comment: occ  . Drug use: No  . Sexual activity: Not on file

## 2017-01-25 LAB — SEDIMENTATION RATE: Sed Rate: 4 mm/hr (ref 0–20)

## 2017-01-27 LAB — C-REACTIVE PROTEIN: CRP: 7.7 mg/L (ref <8.0)

## 2017-01-28 LAB — CBC WITH DIFFERENTIAL

## 2017-02-20 ENCOUNTER — Other Ambulatory Visit (INDEPENDENT_AMBULATORY_CARE_PROVIDER_SITE_OTHER): Payer: Self-pay | Admitting: Orthopaedic Surgery

## 2017-02-20 DIAGNOSIS — M1711 Unilateral primary osteoarthritis, right knee: Secondary | ICD-10-CM

## 2017-02-21 ENCOUNTER — Encounter (HOSPITAL_COMMUNITY)
Admission: RE | Admit: 2017-02-21 | Discharge: 2017-02-21 | Disposition: A | Discharge status: Home / Self Care | Payer: Non-veteran care | Source: Ambulatory Visit | Attending: Orthopaedic Surgery | Admitting: Orthopaedic Surgery

## 2017-02-21 ENCOUNTER — Encounter (HOSPITAL_COMMUNITY): Payer: Self-pay

## 2017-02-21 DIAGNOSIS — Z01812 Encounter for preprocedural laboratory examination: Secondary | ICD-10-CM | POA: Insufficient documentation

## 2017-02-21 DIAGNOSIS — Z01810 Encounter for preprocedural cardiovascular examination: Secondary | ICD-10-CM | POA: Diagnosis present

## 2017-02-21 DIAGNOSIS — M1711 Unilateral primary osteoarthritis, right knee: Secondary | ICD-10-CM | POA: Insufficient documentation

## 2017-02-21 HISTORY — DX: Adverse effect of unspecified anesthetic, initial encounter: T41.45XA

## 2017-02-21 HISTORY — DX: Gastro-esophageal reflux disease without esophagitis: K21.9

## 2017-02-21 HISTORY — DX: Other complications of anesthesia, initial encounter: T88.59XA

## 2017-02-21 HISTORY — DX: Unspecified osteoarthritis, unspecified site: M19.90

## 2017-02-21 HISTORY — DX: Unspecified cataract: H26.9

## 2017-02-21 LAB — URINALYSIS, ROUTINE W REFLEX MICROSCOPIC
BILIRUBIN URINE: NEGATIVE
Glucose, UA: NEGATIVE mg/dL
Hgb urine dipstick: NEGATIVE
Ketones, ur: NEGATIVE mg/dL
Leukocytes, UA: NEGATIVE
Nitrite: NEGATIVE
Protein, ur: NEGATIVE mg/dL
SPECIFIC GRAVITY, URINE: 1.008 (ref 1.005–1.030)
pH: 7 (ref 5.0–8.0)

## 2017-02-21 LAB — PROTIME-INR
INR: 0.95
Prothrombin Time: 12.6 seconds (ref 11.4–15.2)

## 2017-02-21 LAB — CBC WITH DIFFERENTIAL/PLATELET
BASOS ABS: 0 10*3/uL (ref 0.0–0.1)
BASOS PCT: 0 %
Eosinophils Absolute: 0.1 10*3/uL (ref 0.0–0.7)
Eosinophils Relative: 2 %
HEMATOCRIT: 44.2 % (ref 39.0–52.0)
Hemoglobin: 14.2 g/dL (ref 13.0–17.0)
Lymphocytes Relative: 25 %
Lymphs Abs: 1.8 10*3/uL (ref 0.7–4.0)
MCH: 27.4 pg (ref 26.0–34.0)
MCHC: 32.1 g/dL (ref 30.0–36.0)
MCV: 85.2 fL (ref 78.0–100.0)
MONOS PCT: 8 %
Monocytes Absolute: 0.6 10*3/uL (ref 0.1–1.0)
NEUTROS ABS: 4.7 10*3/uL (ref 1.7–7.7)
NEUTROS PCT: 65 %
Platelets: 219 10*3/uL (ref 150–400)
RBC: 5.19 MIL/uL (ref 4.22–5.81)
RDW: 15.5 % (ref 11.5–15.5)
WBC: 7.2 10*3/uL (ref 4.0–10.5)

## 2017-02-21 LAB — SURGICAL PCR SCREEN
MRSA, PCR: NEGATIVE
Staphylococcus aureus: NEGATIVE

## 2017-02-21 LAB — TYPE AND SCREEN
ABO/RH(D): O POS
Antibody Screen: NEGATIVE

## 2017-02-21 LAB — COMPREHENSIVE METABOLIC PANEL
ALBUMIN: 4.3 g/dL (ref 3.5–5.0)
ALK PHOS: 54 U/L (ref 38–126)
ALT: 23 U/L (ref 17–63)
AST: 29 U/L (ref 15–41)
Anion gap: 10 (ref 5–15)
BILIRUBIN TOTAL: 0.8 mg/dL (ref 0.3–1.2)
BUN: 6 mg/dL (ref 6–20)
CALCIUM: 9.3 mg/dL (ref 8.9–10.3)
CO2: 25 mmol/L (ref 22–32)
CREATININE: 0.73 mg/dL (ref 0.61–1.24)
Chloride: 106 mmol/L (ref 101–111)
GFR calc Af Amer: >60 mL/min (ref >60)
GFR calc non Af Amer: >60 mL/min (ref >60)
GLUCOSE: 100 mg/dL — AB (ref 65–99)
Potassium: 3.4 mmol/L — ABNORMAL LOW (ref 3.5–5.1)
Sodium: 141 mmol/L (ref 135–145)
TOTAL PROTEIN: 7.7 g/dL (ref 6.5–8.1)

## 2017-02-21 LAB — ABO/RH: ABO/RH(D): O POS

## 2017-02-21 LAB — C-REACTIVE PROTEIN: CRP: <0.8 mg/dL (ref <1.0)

## 2017-02-21 LAB — APTT: aPTT: 30 seconds (ref 24–36)

## 2017-02-21 LAB — SEDIMENTATION RATE: SED RATE: 5 mm/h (ref 0–16)

## 2017-02-21 NOTE — Pre-Procedure Instructions (Addendum)
Venus Ruhe Angst  02/21/2017      [facility] - [place], [place] - [address]  [place] [address] Phone: [phone] Fax: [phone]    Your procedure is scheduled on 02/26/17.  Report to [facility] Admitting at 630 A.M.  Call this number if you have problems the morning of surgery:  [phone]   Remember:  Do not eat food or drink liquids after midnight.  Take these medicines the morning of surgery with A SIP OF WATER    Cetirizine(zyrtec),gabapentin,omeprazole(prilosec) Oxycodone if needed  STOP all herbel meds, nsaids (aleve,naproxen,advil,ibuprofen) starting Today including all vitamins/supplements, aspirin, fish oil, indomethacin,   Do not wear jewelry, make-up or nail polish.  Do not wear lotions, powders, or perfumes, or deoderant.  Do not shave 48 hours prior to surgery.  Men may shave face and neck.  Do not bring valuables to the hospital.  [facility] is not responsible for any belongings or valuables.  Contacts, dentures or bridgework may not be worn into surgery.  Leave your suitcase in the car.  After surgery it may be brought to your room.  For patients admitted to the hospital, discharge time will be determined by your treatment team.  Patients discharged the day of surgery will not be allowed to drive home.   Special instructions:   Special Instructions: [facility] - Preparing for Surgery  Before surgery, you can play an important role.  Because skin is not sterile, your skin needs to be as free of germs as possible.  You can reduce the number of germs on you skin by washing with CHG (chlorahexidine gluconate) soap before surgery.  CHG is an antiseptic cleaner which kills germs and bonds with the skin to continue killing germs even after washing.  Please DO NOT use if you have an allergy to CHG or antibacterial soaps.  If your skin becomes reddened/irritated stop using the CHG and inform your nurse when you arrive  at Short Stay.  Do not shave (including legs and underarms) for at least 48 hours prior to the first CHG shower.  You may shave your face.  Please follow these instructions carefully:   1.  Shower with CHG Soap the night before surgery and the morning of Surgery.  2.  If you choose to wash your hair, wash your hair first as usual with your normal shampoo.  3.  After you shampoo, rinse your hair and body thoroughly to remove the Shampoo.  4.  Use CHG as you would any other liquid soap.  You can apply chg directly  to the skin and wash gently with scrungie or a clean washcloth.  5.  Apply the CHG Soap to your body ONLY FROM THE NECK DOWN.  Do not use on open wounds or open sores.  Avoid contact with your eyes ears, mouth and genitals (private parts).  Wash genitals (private parts)       with your normal soap.  6.  Wash thoroughly, paying special attention to the area where your surgery will be performed.  7.  Thoroughly rinse your body with warm water from the neck down.  8.  DO NOT shower/wash with your normal soap after using and rinsing off the CHG Soap.  9.  Pat yourself dry with a clean towel.            10.  Wear clean pajamas.            11.  Place clean sheets on your bed the night of your first shower and do not sleep with pets.  Day of Surgery  Do not apply any lotions/deodorants the morning of surgery.  Please wear clean clothes to the hospital/surgery center.  Please read over the  fact sheets that you were given.

## 2017-02-21 NOTE — Progress Notes (Signed)
   02/21/17 1037  OBSTRUCTIVE SLEEP APNEA  Have you ever been diagnosed with sleep apnea through a sleep study? No  Do you snore loudly (loud enough to be heard through closed doors)?  1  Do you often feel tired, fatigued, or sleepy during the daytime (such as falling asleep during driving or talking to someone)? 0  Has anyone observed you stop breathing during your sleep? 1  Do you have, or are you being treated for high blood pressure? 1  BMI more than 35 kg/m2? 0  Age > 50 (1-yes) 1  Neck circumference greater than:Male 16 inches or larger, Male 17inches or larger? 0 (16)  Male Gender (Yes=1) 1  Obstructive Sleep Apnea Score 5  Score 5 or greater  Results sent to PCP

## 2017-02-24 NOTE — Progress Notes (Addendum)
Anesthesia Chart Review:  Pt is a 51 year old male scheduled for R total knee revision on 02/26/2017 with Gershon Mussel, MD.   PMH includes:  HTN, depression.  Current smoker. BMI 26  Anesthesia history: reports he "quit breathing twice during last surgery"; told he may have sleep apnea.   - Pt did test positive on sleep apnea screening tool at PAT 02/21/17; PAT RN faxed to [place], [place]   Medications include: Depakote, indomethacin, lisinopril, latuda, Prilosec  BP (!) 144/100 Comment: taken manually/notified Facilities manager  Pulse 77   Temp 36.8 C   Resp 20   Ht  (1.753 m)   Wt 178 lb 4.8 oz (80.9 kg)   SpO2 100%   BMI 26.33 kg/m    Preoperative labs reviewed.    EKG 02/21/17: NSR. Moderate voltage criteria for LVH, may be normal variant  I attempted to reach pt by telephone without success to discuss elevated BP.  If BP acceptable DOS, I anticipate pt can proceed as scheduled.   Rica Mast, FNP-BC [facility]/Anesthesiology Phone: [phone] 02/25/2017 4:17 PM

## 2017-02-25 MED ORDER — SODIUM CHLORIDE 0.9 % IV SOLN
1000.0000 mg | INTRAVENOUS | Status: AC
Start: 2017-02-26 — End: 2017-02-26
  Administered 2017-02-26: 1000 mg via INTRAVENOUS
  Filled 2017-02-25: qty 10

## 2017-02-25 MED ORDER — CEFAZOLIN SODIUM-DEXTROSE 2-4 GM/100ML-% IV SOLN
2.0000 g | INTRAVENOUS | Status: AC
  Administered 2017-02-26: 2 g via INTRAVENOUS
  Filled 2017-02-25: qty 100

## 2017-02-25 MED ORDER — BUPIVACAINE LIPOSOME 1.3 % IJ SUSP
20.0000 mL | INTRAMUSCULAR | Status: AC
  Administered 2017-02-26: 20 mL
  Filled 2017-02-25: qty 20

## 2017-02-25 NOTE — Anesthesia Preprocedure Evaluation (Addendum)
Anesthesia Evaluation  Patient identified by MRN, date of birth, ID band Patient awake    Reviewed: Allergy & Precautions, NPO status , Patient's Chart, lab work & pertinent test results  Airway Mallampati: II  TM Distance: >3 FB Neck ROM: Full    Dental  (+) Teeth Intact, Dental Advisory Given, Missing   Pulmonary sleep apnea , Current Smoker,    Pulmonary exam normal breath sounds clear to auscultation       Cardiovascular hypertension, Pt. on medications Normal cardiovascular exam Rhythm:Regular Rate:Normal     Neuro/Psych PSYCHIATRIC DISORDERS Depression negative neurological ROS     GI/Hepatic Neg liver ROS, GERD  Medicated,  Endo/Other  negative endocrine ROS  Renal/GU negative Renal ROS     Musculoskeletal  (+) Arthritis , Osteoarthritis,    Abdominal   Peds  Hematology negative hematology ROS (+) Plt 219k   Anesthesia Other Findings Day of surgery medications reviewed with the patient.  Reproductive/Obstetrics                            Anesthesia Physical Anesthesia Plan  ASA: II  Anesthesia Plan: Spinal   Post-op Pain Management:  Regional for Post-op pain   Induction: Intravenous  Airway Management Planned: Simple Face Mask  Additional Equipment:   Intra-op Plan:   Post-operative Plan:   Informed Consent: I have reviewed the patients History and Physical, chart, labs and discussed the procedure including the risks, benefits and alternatives for the proposed anesthesia with the patient or authorized representative who has indicated his/her understanding and acceptance.   Dental advisory given  Plan Discussed with: CRNA, Anesthesiologist and Surgeon  Anesthesia Plan Comments:         Anesthesia Quick Evaluation

## 2017-02-26 ENCOUNTER — Encounter (HOSPITAL_COMMUNITY): Payer: Self-pay | Admitting: Urology

## 2017-02-26 ENCOUNTER — Inpatient Hospital Stay (HOSPITAL_COMMUNITY)
Admission: RE | Admit: 2017-02-26 | Discharge: 2017-02-28 | DRG: 470 | Disposition: A | Discharge status: Home Health | Payer: Non-veteran care | Source: Ambulatory Visit | Attending: Orthopaedic Surgery | Admitting: Orthopaedic Surgery

## 2017-02-26 ENCOUNTER — Inpatient Hospital Stay (HOSPITAL_COMMUNITY): Payer: Non-veteran care | Admitting: Emergency Medicine

## 2017-02-26 ENCOUNTER — Encounter (HOSPITAL_COMMUNITY)
Admission: RE | Disposition: A | Discharge status: Home Health | Payer: Self-pay | Source: Ambulatory Visit | Attending: Orthopaedic Surgery

## 2017-02-26 ENCOUNTER — Inpatient Hospital Stay (HOSPITAL_COMMUNITY): Payer: Non-veteran care | Admitting: Anesthesiology

## 2017-02-26 ENCOUNTER — Inpatient Hospital Stay (HOSPITAL_COMMUNITY): Payer: Non-veteran care

## 2017-02-26 DIAGNOSIS — I10 Essential (primary) hypertension: Secondary | ICD-10-CM | POA: Diagnosis present

## 2017-02-26 DIAGNOSIS — D62 Acute posthemorrhagic anemia: Secondary | ICD-10-CM | POA: Diagnosis not present

## 2017-02-26 DIAGNOSIS — F17210 Nicotine dependence, cigarettes, uncomplicated: Secondary | ICD-10-CM | POA: Diagnosis present

## 2017-02-26 DIAGNOSIS — M1711 Unilateral primary osteoarthritis, right knee: Secondary | ICD-10-CM | POA: Diagnosis present

## 2017-02-26 DIAGNOSIS — Z96659 Presence of unspecified artificial knee joint: Secondary | ICD-10-CM

## 2017-02-26 DIAGNOSIS — K219 Gastro-esophageal reflux disease without esophagitis: Secondary | ICD-10-CM | POA: Diagnosis present

## 2017-02-26 DIAGNOSIS — Z79899 Other long term (current) drug therapy: Secondary | ICD-10-CM

## 2017-02-26 DIAGNOSIS — H269 Unspecified cataract: Secondary | ICD-10-CM | POA: Diagnosis present

## 2017-02-26 DIAGNOSIS — F329 Major depressive disorder, single episode, unspecified: Secondary | ICD-10-CM | POA: Diagnosis present

## 2017-02-26 DIAGNOSIS — Z96651 Presence of right artificial knee joint: Secondary | ICD-10-CM | POA: Diagnosis not present

## 2017-02-26 DIAGNOSIS — T84062A Wear of articular bearing surface of internal prosthetic right knee joint, initial encounter: Secondary | ICD-10-CM | POA: Diagnosis not present

## 2017-02-26 HISTORY — PX: TOTAL KNEE REVISION: SHX996

## 2017-02-26 HISTORY — PX: REVISION TOTAL KNEE ARTHROPLASTY: SUR1280

## 2017-02-26 SURGERY — TOTAL KNEE REVISION
Anesthesia: Spinal | Site: Knee | Laterality: Right

## 2017-02-26 MED ORDER — OXYCODONE HCL 5 MG PO TABS
5.0000 mg | ORAL_TABLET | ORAL | Status: DC | PRN
  Administered 2017-02-26: 15 mg via ORAL
  Administered 2017-02-26: 10 mg via ORAL
  Administered 2017-02-27 – 2017-02-28 (×8): 15 mg via ORAL
  Filled 2017-02-26 (×5): qty 3
  Filled 2017-02-26: qty 2
  Filled 2017-02-26 (×3): qty 3

## 2017-02-26 MED ORDER — SORBITOL 70 % SOLN: 30.0000 mL | Freq: Every day | Status: DC | PRN

## 2017-02-26 MED ORDER — ACETAMINOPHEN 500 MG PO TABS
1000.0000 mg | ORAL_TABLET | Freq: Four times a day (QID) | ORAL | Status: AC
  Administered 2017-02-27 (×3): 1000 mg via ORAL
  Filled 2017-02-26 (×4): qty 2

## 2017-02-26 MED ORDER — DEXAMETHASONE SODIUM PHOSPHATE 10 MG/ML IJ SOLN
10.0000 mg | Freq: Once | INTRAMUSCULAR | Status: AC
  Administered 2017-02-27: 10 mg via INTRAVENOUS
  Filled 2017-02-26: qty 1

## 2017-02-26 MED ORDER — OMEGA-3-ACID ETHYL ESTERS 1 G PO CAPS
1.0000 g | ORAL_CAPSULE | ORAL | Status: DC
  Administered 2017-02-27: 1 g via ORAL
  Filled 2017-02-26: qty 1

## 2017-02-26 MED ORDER — METOCLOPRAMIDE HCL 5 MG PO TABS: 5.0000 mg | ORAL_TABLET | Freq: Three times a day (TID) | ORAL | Status: DC | PRN

## 2017-02-26 MED ORDER — EPHEDRINE 5 MG/ML INJ
INTRAVENOUS | Status: AC
  Filled 2017-02-26: qty 10

## 2017-02-26 MED ORDER — MIDAZOLAM HCL 5 MG/5ML IJ SOLN
INTRAMUSCULAR | Status: DC | PRN
  Administered 2017-02-26: 2 mg via INTRAVENOUS

## 2017-02-26 MED ORDER — METHOCARBAMOL 1000 MG/10ML IJ SOLN
500.0000 mg | Freq: Four times a day (QID) | INTRAVENOUS | Status: DC | PRN
  Filled 2017-02-26: qty 5

## 2017-02-26 MED ORDER — METOCLOPRAMIDE HCL 5 MG/ML IJ SOLN
5.0000 mg | Freq: Three times a day (TID) | INTRAMUSCULAR | Status: DC | PRN
Start: 2017-02-26 — End: 2017-02-28

## 2017-02-26 MED ORDER — PHENOL 1.4 % MT LIQD: 1.0000 | OROMUCOSAL | Status: DC | PRN

## 2017-02-26 MED ORDER — LIDOCAINE HCL (CARDIAC) 20 MG/ML IV SOLN
INTRAVENOUS | Status: DC | PRN
  Administered 2017-02-26: 100 mg via INTRATRACHEAL

## 2017-02-26 MED ORDER — ASPIRIN EC 325 MG PO TBEC
325.0000 mg | DELAYED_RELEASE_TABLET | Freq: Two times a day (BID) | ORAL | Status: DC
  Administered 2017-02-26 – 2017-02-28 (×4): 325 mg via ORAL
  Filled 2017-02-26 (×4): qty 1

## 2017-02-26 MED ORDER — PROPOFOL 1000 MG/100ML IV EMUL
INTRAVENOUS | Status: AC
  Filled 2017-02-26: qty 200

## 2017-02-26 MED ORDER — BUPIVACAINE-EPINEPHRINE (PF) 0.5% -1:200000 IJ SOLN
INTRAMUSCULAR | Status: DC | PRN
  Administered 2017-02-26: 30 mL via PERINEURAL

## 2017-02-26 MED ORDER — MAGNESIUM CITRATE PO SOLN: 1.0000 | Freq: Once | ORAL | Status: DC | PRN

## 2017-02-26 MED ORDER — FENTANYL CITRATE (PF) 250 MCG/5ML IJ SOLN
INTRAMUSCULAR | Status: AC
  Filled 2017-02-26: qty 5

## 2017-02-26 MED ORDER — GABAPENTIN 300 MG PO CAPS
600.0000 mg | ORAL_CAPSULE | Freq: Three times a day (TID) | ORAL | Status: DC
  Administered 2017-02-26 – 2017-02-28 (×6): 600 mg via ORAL
  Filled 2017-02-26 (×6): qty 2

## 2017-02-26 MED ORDER — BUPIVACAINE HCL (PF) 0.5 % IJ SOLN
INTRAMUSCULAR | Status: DC | PRN
  Administered 2017-02-26: 3 mL via INTRATHECAL

## 2017-02-26 MED ORDER — SODIUM CHLORIDE 0.9 % IV SOLN
INTRAVENOUS | Status: DC
  Administered 2017-02-27: via INTRAVENOUS

## 2017-02-26 MED ORDER — PROMETHAZINE HCL 25 MG/ML IJ SOLN: 6.2500 mg | INTRAMUSCULAR | Status: DC | PRN

## 2017-02-26 MED ORDER — SODIUM CHLORIDE 0.9% FLUSH
INTRAVENOUS | Status: DC | PRN
  Administered 2017-02-26: 40 mL via INTRAVENOUS

## 2017-02-26 MED ORDER — CHLORHEXIDINE GLUCONATE 4 % EX LIQD
60.0000 mL | Freq: Once | CUTANEOUS | Status: DC
Start: 2017-02-26 — End: 2017-02-26

## 2017-02-26 MED ORDER — MIDAZOLAM HCL 2 MG/2ML IJ SOLN
INTRAMUSCULAR | Status: AC
  Filled 2017-02-26: qty 2

## 2017-02-26 MED ORDER — DIPHENHYDRAMINE HCL 12.5 MG/5ML PO ELIX: 25.0000 mg | ORAL_SOLUTION | ORAL | Status: DC | PRN

## 2017-02-26 MED ORDER — EPHEDRINE SULFATE 50 MG/ML IJ SOLN
INTRAMUSCULAR | Status: DC | PRN
  Administered 2017-02-26: 10 mg via INTRAVENOUS

## 2017-02-26 MED ORDER — METHOCARBAMOL 750 MG PO TABS: 750.0000 mg | ORAL_TABLET | Freq: Two times a day (BID) | ORAL | 0 refills | Status: DC | PRN

## 2017-02-26 MED ORDER — LACTATED RINGERS IV SOLN
INTRAVENOUS | Status: DC
  Administered 2017-02-26 (×3): via INTRAVENOUS

## 2017-02-26 MED ORDER — ONDANSETRON HCL 4 MG PO TABS: 4.0000 mg | ORAL_TABLET | Freq: Three times a day (TID) | ORAL | 0 refills | Status: DC | PRN

## 2017-02-26 MED ORDER — MIRTAZAPINE 15 MG PO TABS
30.0000 mg | ORAL_TABLET | Freq: Every day | ORAL | Status: DC
  Administered 2017-02-26: 30 mg via ORAL
  Filled 2017-02-26 (×2): qty 2

## 2017-02-26 MED ORDER — ONDANSETRON HCL 4 MG/2ML IJ SOLN
4.0000 mg | Freq: Four times a day (QID) | INTRAMUSCULAR | Status: DC | PRN
  Administered 2017-02-26: 4 mg via INTRAVENOUS
  Filled 2017-02-26: qty 2

## 2017-02-26 MED ORDER — ACETAMINOPHEN 650 MG RE SUPP: 650.0000 mg | Freq: Four times a day (QID) | RECTAL | Status: DC | PRN

## 2017-02-26 MED ORDER — ROCURONIUM BROMIDE 10 MG/ML (PF) SYRINGE
PREFILLED_SYRINGE | INTRAVENOUS | Status: AC
  Filled 2017-02-26: qty 5

## 2017-02-26 MED ORDER — FENTANYL CITRATE (PF) 100 MCG/2ML IJ SOLN
INTRAMUSCULAR | Status: AC
  Administered 2017-02-26: 50 ug via INTRAVENOUS
  Filled 2017-02-26: qty 2

## 2017-02-26 MED ORDER — METHOCARBAMOL 500 MG PO TABS
500.0000 mg | ORAL_TABLET | Freq: Four times a day (QID) | ORAL | Status: DC | PRN
  Administered 2017-02-26 – 2017-02-28 (×4): 500 mg via ORAL
  Filled 2017-02-26 (×3): qty 1

## 2017-02-26 MED ORDER — PROPOFOL 500 MG/50ML IV EMUL
INTRAVENOUS | Status: DC | PRN
  Administered 2017-02-26: 11:00:00 via INTRAVENOUS
  Administered 2017-02-26: 75 ug/kg/min via INTRAVENOUS

## 2017-02-26 MED ORDER — IBUPROFEN 200 MG PO TABS: 400.0000 mg | ORAL_TABLET | Freq: Four times a day (QID) | ORAL | Status: DC | PRN

## 2017-02-26 MED ORDER — METHOCARBAMOL 500 MG PO TABS
ORAL_TABLET | ORAL | Status: AC
  Filled 2017-02-26: qty 1

## 2017-02-26 MED ORDER — TRANEXAMIC ACID 1000 MG/10ML IV SOLN
1000.0000 mg | Freq: Once | INTRAVENOUS | Status: AC
  Administered 2017-02-26: 1000 mg via INTRAVENOUS
  Filled 2017-02-26: qty 10

## 2017-02-26 MED ORDER — ALUM & MAG HYDROXIDE-SIMETH 200-200-20 MG/5ML PO SUSP
30.0000 mL | ORAL | Status: DC | PRN
  Administered 2017-02-26 – 2017-02-28 (×3): 30 mL via ORAL
  Filled 2017-02-26 (×3): qty 30

## 2017-02-26 MED ORDER — OMEGA-3 FATTY ACIDS 1000 MG PO CAPS: 1.0000 g | ORAL_CAPSULE | ORAL | Status: DC

## 2017-02-26 MED ORDER — CEFAZOLIN SODIUM-DEXTROSE 2-4 GM/100ML-% IV SOLN
2.0000 g | Freq: Four times a day (QID) | INTRAVENOUS | Status: AC
  Administered 2017-02-26 – 2017-02-27 (×2): 2 g via INTRAVENOUS
  Filled 2017-02-26 (×2): qty 100

## 2017-02-26 MED ORDER — LIDOCAINE 2% (20 MG/ML) 5 ML SYRINGE
INTRAMUSCULAR | Status: AC
  Filled 2017-02-26: qty 5

## 2017-02-26 MED ORDER — ONDANSETRON HCL 4 MG PO TABS: 4.0000 mg | ORAL_TABLET | Freq: Four times a day (QID) | ORAL | Status: DC | PRN

## 2017-02-26 MED ORDER — PROPOFOL 10 MG/ML IV BOLUS
INTRAVENOUS | Status: AC
  Filled 2017-02-26: qty 20

## 2017-02-26 MED ORDER — FENTANYL CITRATE (PF) 100 MCG/2ML IJ SOLN
25.0000 ug | INTRAMUSCULAR | Status: DC | PRN
  Administered 2017-02-26 (×2): 50 ug via INTRAVENOUS

## 2017-02-26 MED ORDER — DIVALPROEX SODIUM 500 MG PO DR TAB
500.0000 mg | DELAYED_RELEASE_TABLET | Freq: Every evening | ORAL | Status: DC
  Administered 2017-02-26 – 2017-02-27 (×2): 500 mg via ORAL
  Filled 2017-02-26 (×3): qty 1

## 2017-02-26 MED ORDER — OXYCODONE HCL ER 10 MG PO T12A: 10.0000 mg | EXTENDED_RELEASE_TABLET | Freq: Two times a day (BID) | ORAL | 0 refills | Status: DC

## 2017-02-26 MED ORDER — PROMETHAZINE HCL 25 MG PO TABS: 25.0000 mg | ORAL_TABLET | Freq: Four times a day (QID) | ORAL | 1 refills | Status: DC | PRN

## 2017-02-26 MED ORDER — KETOROLAC TROMETHAMINE 15 MG/ML IJ SOLN
30.0000 mg | Freq: Four times a day (QID) | INTRAMUSCULAR | Status: AC
  Administered 2017-02-26 – 2017-02-27 (×4): 30 mg via INTRAVENOUS
  Filled 2017-02-26 (×4): qty 2

## 2017-02-26 MED ORDER — ASPIRIN EC 325 MG PO TBEC: 325.0000 mg | DELAYED_RELEASE_TABLET | Freq: Two times a day (BID) | ORAL | 0 refills | Status: DC

## 2017-02-26 MED ORDER — OXYCODONE HCL ER 15 MG PO T12A
15.0000 mg | EXTENDED_RELEASE_TABLET | Freq: Two times a day (BID) | ORAL | Status: DC
  Administered 2017-02-26 – 2017-02-28 (×5): 15 mg via ORAL
  Filled 2017-02-26 (×5): qty 1

## 2017-02-26 MED ORDER — LURASIDONE HCL 80 MG PO TABS
80.0000 mg | ORAL_TABLET | Freq: Every day | ORAL | Status: DC
  Administered 2017-02-26: 80 mg via ORAL
  Filled 2017-02-26 (×2): qty 1

## 2017-02-26 MED ORDER — FLUTICASONE PROPIONATE 50 MCG/ACT NA SUSP
1.0000 | Freq: Every day | NASAL | Status: DC | PRN
  Filled 2017-02-26: qty 16

## 2017-02-26 MED ORDER — FENTANYL CITRATE (PF) 100 MCG/2ML IJ SOLN
50.0000 ug | Freq: Once | INTRAMUSCULAR | Status: AC
  Administered 2017-02-26: 50 ug via INTRAVENOUS
  Filled 2017-02-26: qty 1

## 2017-02-26 MED ORDER — FENTANYL CITRATE (PF) 100 MCG/2ML IJ SOLN
INTRAMUSCULAR | Status: DC | PRN
  Administered 2017-02-26: 25 ug via INTRAVENOUS
  Administered 2017-02-26: 50 ug via INTRAVENOUS
  Administered 2017-02-26: 25 ug via INTRAVENOUS
  Administered 2017-02-26 (×3): 50 ug via INTRAVENOUS

## 2017-02-26 MED ORDER — ACETAMINOPHEN 325 MG PO TABS
650.0000 mg | ORAL_TABLET | Freq: Four times a day (QID) | ORAL | Status: DC | PRN
  Administered 2017-02-26 – 2017-02-28 (×3): 650 mg via ORAL
  Filled 2017-02-26 (×3): qty 2

## 2017-02-26 MED ORDER — OXYCODONE HCL 5 MG PO TABS
ORAL_TABLET | ORAL | Status: AC
  Filled 2017-02-26: qty 3

## 2017-02-26 MED ORDER — LISINOPRIL 20 MG PO TABS
20.0000 mg | ORAL_TABLET | Freq: Every day | ORAL | Status: DC
  Administered 2017-02-27 – 2017-02-28 (×2): 20 mg via ORAL
  Filled 2017-02-26 (×2): qty 1

## 2017-02-26 MED ORDER — SODIUM CHLORIDE 0.9 % IR SOLN
Status: DC | PRN
  Administered 2017-02-26 (×2): 3000 mL

## 2017-02-26 MED ORDER — SENNOSIDES-DOCUSATE SODIUM 8.6-50 MG PO TABS: 1.0000 | ORAL_TABLET | Freq: Every evening | ORAL | 1 refills | Status: DC | PRN

## 2017-02-26 MED ORDER — POLYETHYLENE GLYCOL 3350 17 G PO PACK: 17.0000 g | PACK | Freq: Every day | ORAL | Status: DC | PRN

## 2017-02-26 MED ORDER — ADULT MULTIVITAMIN W/MINERALS CH
1.0000 | ORAL_TABLET | Freq: Every day | ORAL | Status: DC
  Administered 2017-02-27 – 2017-02-28 (×2): 1 via ORAL
  Filled 2017-02-26 (×2): qty 1

## 2017-02-26 MED ORDER — CHLORHEXIDINE GLUCONATE 4 % EX LIQD: 60.0000 mL | Freq: Once | CUTANEOUS | Status: DC

## 2017-02-26 MED ORDER — MENTHOL 3 MG MT LOZG
1.0000 | LOZENGE | OROMUCOSAL | Status: DC | PRN
Start: 2017-02-26 — End: 2017-02-28

## 2017-02-26 MED ORDER — MIDAZOLAM HCL 2 MG/2ML IJ SOLN
2.0000 mg | Freq: Once | INTRAMUSCULAR | Status: AC
  Administered 2017-02-26: 2 mg via INTRAVENOUS
  Filled 2017-02-26: qty 2

## 2017-02-26 MED ORDER — MORPHINE SULFATE (PF) 2 MG/ML IV SOLN
1.0000 mg | INTRAVENOUS | Status: DC | PRN
  Administered 2017-02-26 – 2017-02-27 (×4): 1 mg via INTRAVENOUS
  Filled 2017-02-26 (×4): qty 1

## 2017-02-26 MED ORDER — MIDAZOLAM HCL 2 MG/2ML IJ SOLN
INTRAMUSCULAR | Status: AC
  Administered 2017-02-26: 2 mg via INTRAVENOUS
  Filled 2017-02-26: qty 2

## 2017-02-26 MED ORDER — OXYCODONE HCL 5 MG PO TABS: 5.0000 mg | ORAL_TABLET | ORAL | 0 refills | Status: DC | PRN

## 2017-02-26 SURGICAL SUPPLY — 72 items
ARTICULAR INSERT ×2 IMPLANT
BASEPLATE LEGION REV TIBIAL (Plate) ×2 IMPLANT
BLADE CLIPPER SURG (BLADE) IMPLANT
BLADE SAG 18X100X1.27 (BLADE) ×3 IMPLANT
BLADE SAGITTAL 25.0X1.27X90 (BLADE) ×2 IMPLANT
BLADE SAGITTAL 25.0X1.27X90MM (BLADE) ×1
BNDG CMPR MED 10X6 ELC LF (GAUZE/BANDAGES/DRESSINGS) ×1
BNDG ELASTIC 6X10 VLCR STRL LF (GAUZE/BANDAGES/DRESSINGS) ×2 IMPLANT
BONE CEMENT PALACOS R-G (Cement) ×9 IMPLANT
BOWL SMART MIX CTS (DISPOSABLE) ×2 IMPLANT
BSPLAT TIB 6 CMNT REV F TPR KN (Plate) ×1 IMPLANT
CEMENT BONE PALACOS R-G (Cement) IMPLANT
CLOSURE STERI-STRIP 1/2X4 (GAUZE/BANDAGES/DRESSINGS) ×1
CLSR STERI-STRIP ANTIMIC 1/2X4 (GAUZE/BANDAGES/DRESSINGS) ×1 IMPLANT
COUPLER LEGION OFFSET 6MM KNEE (Orthopedic Implant) ×2 IMPLANT
COVER SURGICAL LIGHT HANDLE (MISCELLANEOUS) ×3 IMPLANT
CUFF TOURNIQUET SINGLE 34IN LL (TOURNIQUET CUFF) ×2 IMPLANT
CUFF TOURNIQUET SINGLE 44IN (TOURNIQUET CUFF) IMPLANT
DRAPE ORTHO SPLIT 77X108 STRL (DRAPES) ×6
DRAPE SURG ORHT 6 SPLT 77X108 (DRAPES) ×2 IMPLANT
DRAPE U-SHAPE 47X51 STRL (DRAPES) ×3 IMPLANT
DRSG AQUACEL AG ADV 3.5X10 (GAUZE/BANDAGES/DRESSINGS) ×2 IMPLANT
DRSG AQUACEL AG ADV 3.5X14 (GAUZE/BANDAGES/DRESSINGS) ×2 IMPLANT
DURAPREP 26ML APPLICATOR (WOUND CARE) ×8 IMPLANT
ELECT CAUTERY BLADE 6.4 (BLADE) ×3 IMPLANT
ELECT REM PT RETURN 9FT ADLT (ELECTROSURGICAL) ×3
ELECTRODE REM PT RTRN 9FT ADLT (ELECTROSURGICAL) ×1 IMPLANT
EVACUATOR 1/8 PVC DRAIN (DRAIN) IMPLANT
FEMORAL GENESIS PRIM LUGS (Orthopedic Implant) ×2 IMPLANT
FEMUR OXINIUM SZ 5 RT (Knees) ×2 IMPLANT
GLOVE SKINSENSE NS SZ7.5 (GLOVE) ×4
GLOVE SKINSENSE STRL SZ7.5 (GLOVE) ×2 IMPLANT
GOWN STRL REIN XL XLG (GOWN DISPOSABLE) ×3 IMPLANT
HANDPIECE INTERPULSE COAX TIP (DISPOSABLE)
HOOD PEEL AWAY FLYTE STAYCOOL (MISCELLANEOUS) ×6 IMPLANT
KIT BASIN OR (CUSTOM PROCEDURE TRAY) ×3 IMPLANT
KIT ROOM TURNOVER OR (KITS) ×3 IMPLANT
MANIFOLD NEPTUNE II (INSTRUMENTS) ×3 IMPLANT
MARKER SKIN DUAL TIP RULER LAB (MISCELLANEOUS) ×3 IMPLANT
NDL SPNL 18GX3.5 QUINCKE PK (NEEDLE) ×1 IMPLANT
NEEDLE SPNL 18GX3.5 QUINCKE PK (NEEDLE) ×3 IMPLANT
NS IRRIG 1000ML POUR BTL (IV SOLUTION) ×3 IMPLANT
PACK TOTAL JOINT (CUSTOM PROCEDURE TRAY) ×3 IMPLANT
PAD ARMBOARD 7.5X6 YLW CONV (MISCELLANEOUS) ×6 IMPLANT
PATELLA 32MM (Knees) ×2 IMPLANT
PIN TROCAR 3 INCH (PIN) ×8 IMPLANT
PIN TROCAR 5 (PIN) ×8 IMPLANT
SAW OSC TIP CART 19.5X105X1.3 (SAW) ×2 IMPLANT
SEALER BIPOLAR AQUA 6.0 (INSTRUMENTS) ×3 IMPLANT
SET HNDPC FAN SPRY TIP SCT (DISPOSABLE) IMPLANT
STEM CEMENTED KNEE (Knees) ×2 IMPLANT
SUCTION FRAZIER HANDLE 10FR (MISCELLANEOUS) ×2
SUCTION TUBE FRAZIER 10FR DISP (MISCELLANEOUS) ×1 IMPLANT
SUT MNCRL AB 3-0 PS2 18 (SUTURE) ×2 IMPLANT
SUT MNCRL AB 3-0 PS2 27 (SUTURE) ×1 IMPLANT
SUT PDS AB 1 CT  36 (SUTURE)
SUT PDS AB 1 CT 36 (SUTURE) ×2 IMPLANT
SUT VIC AB 0 CT1 27 (SUTURE) ×6
SUT VIC AB 0 CT1 27XBRD ANBCTR (SUTURE) ×2 IMPLANT
SUT VIC AB 1 CTX 36 (SUTURE) ×15
SUT VIC AB 1 CTX36XBRD ANBCTR (SUTURE) ×3 IMPLANT
SUT VIC AB 2-0 CT1 27 (SUTURE) ×9
SUT VIC AB 2-0 CT1 TAPERPNT 27 (SUTURE) ×2 IMPLANT
SWAB COLLECTION DEVICE MRSA (MISCELLANEOUS) IMPLANT
SWAB CULTURE ESWAB REG 1ML (MISCELLANEOUS) ×3 IMPLANT
SYR 20CC LL (SYRINGE) ×3 IMPLANT
SYR 50ML LL SCALE MARK (SYRINGE) ×3 IMPLANT
TOWEL OR 17X24 6PK STRL BLUE (TOWEL DISPOSABLE) ×3 IMPLANT
TOWEL OR 17X26 10 PK STRL BLUE (TOWEL DISPOSABLE) ×3 IMPLANT
TRAY FOLEY W/METER SILVER 16FR (SET/KITS/TRAYS/PACK) ×2 IMPLANT
WRAP KNEE MAXI GEL POST OP (GAUZE/BANDAGES/DRESSINGS) ×2 IMPLANT
YANKAUER SUCT BULB TIP NO VENT (SUCTIONS) ×2 IMPLANT

## 2017-02-26 NOTE — Discharge Instructions (Signed)

## 2017-02-26 NOTE — Progress Notes (Signed)
Orthopedic Tech Progress Note Patient Details:  Keith Garza February 02, 1966 [id number]  Ortho Devices Ortho Device/Splint Location: put on cpm at 2110   [name] 02/26/2017, 9:17 PM

## 2017-02-26 NOTE — Transfer of Care (Signed)
Immediate Anesthesia Transfer of Care Note  Patient: Keith Garza  Procedure(s) Performed: Procedure(s): RIGHT TOTAL KNEE REVISION (Right)  Patient Location: PACU  Anesthesia Type: MAC  Level of Consciousness: awake, alert , oriented and patient cooperative  Airway & Oxygen Therapy: Patient Spontanous Breathing  Post-op Assessment: Report given to RN and Post -op Vital signs reviewed and stable  Post vital signs: Reviewed and stable  Last Vitals:  Vitals:   02/26/17 0811 02/26/17 1152  BP: 124/72   Pulse: 77   Resp: 15   Temp:  (P) 36.3 C    Last Pain:  Vitals:   02/26/17 1152  TempSrc:   PainSc: (P) 0-No pain         Complications: No apparent anesthesia complications

## 2017-02-26 NOTE — Progress Notes (Signed)
Orthopedic Tech Progress Note Patient Details:  Keith Garza 1965-12-03 [id number]  Ortho Devices Ortho Device/Splint Location: on cpm at 1850   [name] 02/26/2017, 6:52 PM

## 2017-02-26 NOTE — Anesthesia Postprocedure Evaluation (Signed)
Anesthesia Post Note  Patient: Keith Garza  Procedure(s) Performed: Procedure(s) (LRB): RIGHT TOTAL KNEE REVISION (Right)  Patient location during evaluation: PACU Anesthesia Type: Spinal Level of consciousness: awake Pain management: pain level controlled Vital Signs Assessment: post-procedure vital signs reviewed and stable Respiratory status: spontaneous breathing Cardiovascular status: stable Postop Assessment: no headache, no backache, spinal receding, patient able to bend at knees and no signs of nausea or vomiting Anesthetic complications: no        Last Vitals:  Vitals:   02/26/17 1354 02/26/17 1407  BP: (!) 144/90 (!) 137/99  Pulse: (!) 59 (!) 54  Resp: 13 (!) 9  Temp:      Last Pain:  Vitals:   02/26/17 1420  TempSrc:   PainSc: 3    Pain Goal:    LLE Motor Response: Responds to commands (02/26/17 1420)   RLE Motor Response: Responds to commands (02/26/17 1420)   L Sensory Level: L5-Outer lower leg, top of foot, great toe (02/26/17 1420) R Sensory Level: L5-Outer lower leg, top of foot, great toe (02/26/17 1420)  Ipek Westra JR,JOHN Lance [place]

## 2017-02-26 NOTE — Op Note (Signed)
Total Knee Revision Arthroplasty Procedure Note  Preoperative diagnosis: Right tricompartmental osteoarthritis s/p medial unicompartmental arthroplasty  Postoperative diagnosis:same  Operative procedure: Right total knee revision arthroplasty of both femoral and tibial components  Surgeon: N. Glee Arvin, MD  Assistants: April Green, RNFA  Anesthesia: Spinal, regional  Tourniquet time: 100 minutes  Implants used: [employer]  Femur: Legion PS 5 Tibia: Legion revision tibial baseplate 6; 6 mm offset Patella: 32 mm, 7.5 thick Polyethylene: 9 mm  Indication: Keith Garza is a 51 y.o. year old male with a history of knee pain. Having failed conservative management, the patient elected to proceed with a total knee arthroplasty.  We have reviewed the risk and benefits of the surgery and they elected to proceed after voicing understanding.  Procedure:  After informed consent was obtained and understanding of the risk were voiced including but not limited to bleeding, infection, damage to surrounding structures including nerves and vessels, blood clots, leg length inequality and the failure to achieve desired results, the operative extremity was marked with verbal confirmation of the patient in the holding area.   The patient was then brought to the operating room and transported to the operating room table in the supine position.  A tourniquet was applied to the operative extremity around the upper thigh. The operative limb was then prepped and draped in the usual sterile fashion and preoperative antibiotics were administered.  A time out was performed prior to the start of surgery confirming the correct extremity, preoperative antibiotic administration, as well as team members, implants and instruments available for the case. Correct surgical site was also confirmed with preoperative radiographs. The limb was then elevated for exsanguination and the tourniquet was inflated. I used  a midline incision extending both proximally and distally from the previous incision. The previous surgical scar was ellipsed out. Blunt dissection was then performed down to the capsule. The old Ethibond sutures were removed. A medial parapatellar arthrotomy was then performed. The medial peel was then performed in order to gain exposure. The infrapatellar fat pad was also excised for exposure. Once this was done the patella was then everted.  The patella was sized to a 32 mm button. The saw cut was made. Manhole cover was then placed on the patella.  The patella was then slid laterally and the knee was flexed up. Retractors were placed. Soft tissue releases were then performed to gain adequate exposure. We then drilled the hole for the femoral cutting guide. The distal cutting guide was set to 5 of valgus taking 11 mm of distal femur. After this was performed we then carefully removed the previous femoral component with an osteotome. There was no bone loss once this was removed. The saw was then used to freshen the bony surface back to bleeding bone. I then turned my attention to the tibia. With retractors in place the tibia guide was then placed and 16 mm of bone was taken from the high side which was the lateral condyle. I then carefully removed the tibial component using an osteotome. There was a small amount of bone loss. There was also a small amount of metallosis. All this material was removed using [place]. The saw was used to freshen up the bony surface back to bleeding bone. Once this was done we then turned our attention back to the femur and was sized to a size 5. 4 in 1 cuts were then made. The femoral component was then placed and lug holes and the  box were prepared. He then free floated the tibia in order to determine the rotation. The tibial rotation was then marked. The trial components were then removed. The trial tibial baseplate was then placed with a tibial stem with 6 mm of offset posterior  medially. Sequential reaming was performed of the tibial canal to accommodate the stem. Once this was done try components were then removed. The wound was copiously irrigated with pulse lavage. Exparel solution was injected into the posterior capsule and around the knee. The bony surfaces were then dried. The cement was then mixed under vacuum. Final components were then malleted into place. A final 9 mm poly-ethylene liner was then placed. The knee was stable through the entire arc of motion. Patella tracking was excellent.  A drain was not placed.  Capsular closure was performed with a #1 vicryl, subcutaneous fat closed with a 2.0 vicryl suture, then subcutaneous tissue closed with interrupted 2.0 vicryl suture. The skin was then closed with a 3.0 monocryl. A sterile dressing was applied.   The patient was awakened in the operating room and taken to recovery in stable condition. All sponge, needle, and instrument counts were correct at the end of the case.  Position: supine  Complications: none.  Time Out: performed   Drains/Packing: none  Estimated blood loss: 50 cc  Returned to Recovery Room: in good condition.   Antibiotics: yes   Mechanical VTE (DVT) Prophylaxis: sequential compression devices, TED thigh-high  Chemical VTE (DVT) Prophylaxis: aspirin  Fluid Replacement  Crystalloid: see anesthesia record Blood: none  FFP: none   Specimens Removed: 1 to pathology   Sponge and Instrument Count Correct? yes   PACU: portable radiograph - knee AP and Lateral   Admission: inpatient status  Plan/RTC: Return in 2 weeks for wound check.   Weight Bearing/Load Lower Extremity: full   N. Glee Arvin, MD [facility] [phone] 11:23 AM

## 2017-02-26 NOTE — Anesthesia Procedure Notes (Signed)
Spinal  Patient location during procedure: OR Start time: 02/26/2017 9:43 AM End time: 02/26/2017 9:48 AM Staffing Anesthesiologist: Cecile Hearing Performed: anesthesiologist  Preanesthetic Checklist Completed: patient identified, surgical consent, pre-op evaluation, timeout performed, IV checked, risks and benefits discussed and monitors and equipment checked Spinal Block Patient position: sitting Prep: site prepped and draped and DuraPrep Patient monitoring: continuous pulse ox and blood pressure Approach: midline Location: L3-4 Injection technique: single-shot Needle Needle type: Pencan  Needle gauge: 24 G Needle length: 9 cm Additional Notes Functioning IV was confirmed and monitors were applied. Sterile prep and drape, including hand hygiene, mask and sterile gloves were used. The patient was positioned and the spine was prepped. The skin was anesthetized with lidocaine.  Free flow of clear CSF was obtained prior to injecting local anesthetic into the CSF.  The spinal needle aspirated freely following injection.  The needle was carefully withdrawn.  The patient tolerated the procedure well. Consent was obtained prior to procedure with all questions answered and concerns addressed. Risks including but not limited to bleeding, infection, nerve damage, paralysis, failed block, inadequate analgesia, allergic reaction, high spinal, itching and headache were discussed and the patient wished to proceed.   Keith Aran, MD

## 2017-02-26 NOTE — H&P (Signed)
PREOPERATIVE H&P  Chief Complaint: right knee degenerative joint disease, s/p unicompartmental knee arthroplasty  HPI: Keith Garza is a 51 y.o. male who presents for surgical treatment of right knee degenerative joint disease, s/p unicompartmental knee arthroplasty.  He denies any changes in medical history.  Past Medical History:  Diagnosis Date  . Arthritis   . Cataract    left eye   to have surgery in may 18  . Complication of anesthesia    "quit breathing twice during last surgery"-told might have sleep apnea  . Depression   . GERD (gastroesophageal reflux disease)   . Hypertension    Past Surgical History:  Procedure Laterality Date  . FOOT SURGERY Left    fx middle toe  . KNEE ARTHROPLASTY Right 2008   medial knee  . SPERMATOCELECTOMY Left 2009   Social History   Social History  . Marital status: Single    Spouse name: N/A  . Number of children: N/A  . Years of education: N/A   Social History Main Topics  . Smoking status: Current Some Day Smoker    Packs/day: 0.50    Years: 35.00    Types: Cigarettes  . Smokeless tobacco: Never Used  . Alcohol use Yes     Comment: weekend 12 pack  . Drug use: No  . Sexual activity: Not Asked   Other Topics Concern  . None   Social History Narrative  . None   History reviewed. No pertinent family history. Allergies  Allergen Reactions  . No Known Allergies    Prior to Admission medications   Medication Sig Start Date End Date Taking? Authorizing Provider  cetirizine (ZYRTEC) 10 MG tablet Take 10 mg by mouth daily.    Yes Historical Provider, MD  divalproex (DEPAKOTE) 500 MG DR tablet Take 500 mg by mouth every evening.    Yes Historical Provider, MD  fish oil-omega-3 fatty acids 1000 MG capsule Take 1 g by mouth 2 (two) times a week.    Yes Historical Provider, MD  fluticasone (FLONASE) 50 MCG/ACT nasal spray Place 1 spray into both nostrils daily as needed for allergies or rhinitis.   Yes Historical  Provider, MD  gabapentin (NEURONTIN) 300 MG capsule Take 600 mg by mouth 3 (three) times daily.    Yes Historical Provider, MD  ibuprofen (ADVIL,MOTRIN) 200 MG tablet Take 400 mg by mouth every 6 (six) hours as needed for mild pain.   Yes Historical Provider, MD  indomethacin (INDOCIN) 50 MG capsule Take 1 capsule (50 mg total) by mouth 3 (three) times daily with meals. Patient taking differently: Take 50 mg by mouth 3 (three) times daily as needed for mild pain.  02/06/15  Yes Standley Brooking, MD  lisinopril (PRINIVIL,ZESTRIL) 40 MG tablet Take 20 mg by mouth daily.   Yes Historical Provider, MD  lurasidone (LATUDA) 80 MG TABS tablet Take 80 mg by mouth at bedtime.   Yes Historical Provider, MD  mirtazapine (REMERON) 30 MG tablet Take 30 mg by mouth at bedtime.   Yes Historical Provider, MD  Multiple Vitamin (MULTIVITAMIN WITH MINERALS) TABS tablet Take 1 tablet by mouth daily.   Yes Historical Provider, MD  omeprazole (PRILOSEC) 20 MG capsule Take 20 mg by mouth 2 (two) times daily.   Yes Historical Provider, MD  oxyCODONE-acetaminophen (PERCOCET/ROXICET) 5-325 MG tablet Take 2 tablets by mouth every 8 (eight) hours as needed for severe pain.    Yes Historical Provider, MD  cephALEXin (KEFLEX) 500 MG  capsule Take 1 capsule (500 mg total) by mouth every 8 (eight) hours. Patient not taking: Reported on 02/20/2017 02/06/15   Standley Brooking, MD  traMADol (ULTRAM) 50 MG tablet Take 1 tablet (50 mg total) by mouth every 6 (six) hours as needed for moderate pain. Patient not taking: Reported on 02/20/2017 02/06/15   Standley Brooking, MD     Positive ROS: All other systems have been reviewed and were otherwise negative with the exception of those mentioned in the HPI and as above.  Physical Exam: General: Alert, no acute distress Cardiovascular: No pedal edema Respiratory: No cyanosis, no use of accessory musculature GI: abdomen soft Skin: No lesions in the area of chief complaint Neurologic:  Sensation intact distally Psychiatric: Patient is competent for consent with normal mood and affect Lymphatic: no lymphedema  MUSCULOSKELETAL: exam stable  Assessment: right knee degenerative joint disease, s/p unicompartmental knee arthroplasty  Plan: Plan for Procedure(s): RIGHT TOTAL KNEE REVISION  The risks benefits and alternatives were discussed with the patient including but not limited to the risks of nonoperative treatment, versus surgical intervention including infection, bleeding, nerve injury,  blood clots, cardiopulmonary complications, morbidity, mortality, among others, and they were willing to proceed.   Glee Arvin, MD   02/26/2017 11:22 AM

## 2017-02-26 NOTE — Progress Notes (Signed)
Orthopedic Tech Progress Note Patient Details:  Keith Garza 1966/09/01 [id number] Delete order for footsie roll CPM Right Knee CPM Right Knee: On Right Knee Flexion (Degrees): 90 Right Knee Extension (Degrees): 0 Additional Comments: trapeze bar patient helper   [name] 02/26/2017, 1:13 PM

## 2017-02-26 NOTE — Anesthesia Procedure Notes (Signed)
Anesthesia Regional Block: Adductor canal block   Pre-Anesthetic Checklist: ,, timeout performed, Correct Patient, Correct Site, Correct Laterality, Correct Procedure, Correct Position, site marked, Risks and benefits discussed,  Surgical consent,  Pre-op evaluation,  At surgeon's request and post-op pain management  Laterality: Right  Prep: chloraprep       Needles:  Injection technique: Single-shot  Needle Type: Echogenic Needle     Needle Length: 9cm  Needle Gauge: 21     Additional Needles:   Procedures: ultrasound guided,,,,,,,,  Narrative:  Start time: 02/26/2017 7:56 AM End time: 02/26/2017 8:01 AM Injection made incrementally with aspirations every 5 mL.  Performed by: Personally  Anesthesiologist: Cecile Hearing  Additional Notes: No pain on injection. No increased resistance to injection. Injection made in 5cc increments.  Good needle visualization.  Patient tolerated procedure well.

## 2017-02-26 NOTE — Progress Notes (Signed)
Orthopedic Tech Progress Note Patient Details:  Keith Garza 1966-04-28 [id number] On cpm at  Patient ID: Keith Garza, male   DOB: 1966/10/18, 51 y.o.   MRN: [id number]   Keith Garza 02/26/2017, 6:51 PM

## 2017-02-26 NOTE — Anesthesia Procedure Notes (Signed)
Procedure Name: MAC Date/Time: 02/26/2017 8:51 AM Performed by: Lance Coon Pre-anesthesia Checklist: Patient identified, Emergency Drugs available, Suction available, Patient being monitored and Timeout performed Patient Re-evaluated:Patient Re-evaluated prior to inductionOxygen Delivery Method: Nasal cannula

## 2017-02-27 ENCOUNTER — Encounter (HOSPITAL_COMMUNITY): Payer: Self-pay | Admitting: General Practice

## 2017-02-27 LAB — BASIC METABOLIC PANEL
Anion gap: 4 — ABNORMAL LOW (ref 5–15)
BUN: 5 mg/dL — ABNORMAL LOW (ref 6–20)
CALCIUM: 8.3 mg/dL — AB (ref 8.9–10.3)
CO2: 29 mmol/L (ref 22–32)
CREATININE: 0.78 mg/dL (ref 0.61–1.24)
Chloride: 108 mmol/L (ref 101–111)
GFR calc non Af Amer: >60 mL/min (ref >60)
GLUCOSE: 112 mg/dL — AB (ref 65–99)
Potassium: 3.6 mmol/L (ref 3.5–5.1)
Sodium: 141 mmol/L (ref 135–145)

## 2017-02-27 LAB — CBC
HEMATOCRIT: 31.6 % — AB (ref 39.0–52.0)
Hemoglobin: 9.8 g/dL — ABNORMAL LOW (ref 13.0–17.0)
MCH: 27.1 pg (ref 26.0–34.0)
MCHC: 31 g/dL (ref 30.0–36.0)
MCV: 87.3 fL (ref 78.0–100.0)
Platelets: 148 10*3/uL — ABNORMAL LOW (ref 150–400)
RBC: 3.62 MIL/uL — ABNORMAL LOW (ref 4.22–5.81)
RDW: 15.5 % (ref 11.5–15.5)
WBC: 5.8 10*3/uL (ref 4.0–10.5)

## 2017-02-27 MED ORDER — MORPHINE SULFATE (PF) 4 MG/ML IV SOLN
1.0000 mg | INTRAVENOUS | Status: DC | PRN
Start: 2017-02-27 — End: 2017-02-28
  Administered 2017-02-27 – 2017-02-28 (×7): 1 mg via INTRAVENOUS
  Filled 2017-02-27 (×7): qty 1

## 2017-02-27 MED ORDER — PANTOPRAZOLE SODIUM 40 MG PO TBEC
40.0000 mg | DELAYED_RELEASE_TABLET | Freq: Every day | ORAL | Status: DC
  Administered 2017-02-27 – 2017-02-28 (×2): 40 mg via ORAL
  Filled 2017-02-27 (×2): qty 1

## 2017-02-27 NOTE — Progress Notes (Addendum)
Physical Therapy Treatment Patient Details Name: Keith Garza MRN: [id number] DOB: 1966/02/15 Today's Date: 02/27/2017    History of Present Illness 51 y/o male s/p R TKA (02/26/17).  PMH includes arthritis, cataracts, depression, GERD, HTN    PT Comments    Pt making steady progress.  He was able to increase ambulation distance with some c/o feeling "like joint is loose".  Ortho MD is aware.  Besides gait, focused on education with girlfriend and re-issues illustrated HEP, which pt could not locate from earlier session.  Reviewed no pillow under knee and exercises.  Girlfriend is supportive and familiar with exercises. She states he will be able to reach both rails with steps to enter her home.  Will need stair training tomorrow. Con't to recommend HHPT, RW, and 3-1 BSC.   Follow Up Recommendations  Home health PT;Supervision - Intermittent     Equipment Recommendations  Rolling walker with 5" wheels;3in1 (PT)    Recommendations for Other Services       Precautions / Restrictions Precautions Precautions: Knee Precaution Booklet Issued: Yes (comment) Precaution Comments: reviewed with pt and girlfriend Restrictions Weight Bearing Restrictions: Yes RLE Weight Bearing: Weight bearing as tolerated    Mobility  Bed Mobility Overal bed mobility: Needs Assistance Bed Mobility: Sit to Supine       Sit to supine: Min assist   General bed mobility comments: MIN A for R LE  Transfers Overall transfer level: Needs assistance Equipment used: Rolling walker (2 wheeled) Transfers: Sit to/from Stand Sit to Stand: Supervision         General transfer comment: cues for hand placement  Ambulation/Gait Ambulation/Gait assistance: Min guard Ambulation Distance (Feet): 95 Feet Assistive device: Rolling walker (2 wheeled) Gait Pattern/deviations: Step-through pattern;Decreased stance time - right;Antalgic Gait velocity: decreased Gait velocity interpretation: Below normal  speed for age/gender General Gait Details: pt c/o feeling like joint is loose with WB.  MD stopped by later and was informed.   Stairs            Wheelchair Mobility    Modified Rankin (Stroke Patients Only)       Balance Overall balance assessment: Needs assistance   Sitting balance-Leahy Scale: Good       Standing balance-Leahy Scale: Poor Standing balance comment: requires UE support                            Cognition Arousal/Alertness: Awake/alert Behavior During Therapy: WFL for tasks assessed/performed Overall Cognitive Status: Within Functional Limits for tasks assessed                                        Exercises Total Joint Exercises Ankle Circles/Pumps: AROM;Both;10 reps;Supine  Straight Leg Raises: AAROM;Right;5 reps;Supine     General Comments General comments (skin integrity, edema, etc.): A MD in unwrapping ACE wrap with extended SLR.  Girlfriend present and reviewed therex with her.  She is familiar with the exercises and verbalized she would have him do them later when pain is decreased.      Pertinent Vitals/Pain Pain Assessment: 0-10 Pain Score: 8  Pain Location: knee Pain Descriptors / Indicators: Throbbing;Burning Pain Intervention(s): Limited activity within patient's tolerance;Monitored during session;Patient requesting pain meds-RN notified    Home Living Family/patient expects to be discharged to:: Unsure Living Arrangements: Parent  Prior Function            PT Goals (current goals can now be found in the care plan section) Acute Rehab PT Goals Patient Stated Goal: decrease pain PT Goal Formulation: With patient Time For Goal Achievement: 03/06/17 Potential to Achieve Goals: Good Progress towards PT goals: Progressing toward goals    Frequency    7X/week      PT Plan Current plan remains appropriate    Co-evaluation             End of Session  Equipment Utilized During Treatment: Gait belt Activity Tolerance: Patient tolerated treatment well Patient left: in bed;with call bell/phone within reach;with family/visitor present Nurse Communication: Mobility status;Patient requests pain meds PT Visit Diagnosis: Difficulty in walking, not elsewhere classified (R26.2);Pain Pain - Right/Left: Right Pain - part of body: Knee     Time: 1610-9604 PT Time Calculation (min) (ACUTE ONLY): 24 min  Charges:  $Gait Training: 8-22 mins $Therapeutic Exercise: 8-22 mins                    G Codes:       Cloyce Paterson L. Katrinka Blazing, [place] Pager [phone] 02/27/2017    Enzo Montgomery 02/27/2017, 2:01 PM

## 2017-02-27 NOTE — Evaluation (Signed)
Physical Therapy Evaluation Patient Details Name: Keith Garza MRN: [id number] DOB: August 03, 1966 Today's Date: 02/27/2017   History of Present Illness  51 y/o male s/p R TKA (02/26/17).  PMH includes arthritis, cataracts, depression, GERD, HTN  Clinical Impression  Pt admitted with above diagnosis. Pt currently with functional limitations due to the deficits listed below (see PT Problem List).  Pt will benefit from skilled PT to increase their independence and safety with mobility to allow discharge to the venue listed below.  Pt should progress well. His plan is to d/c to girlfriend's house in [place], rather than parents' home in [place], due to having more A with girlfriend.  Recommend HHPT, RW,  And 3-1 BSC.     Follow Up Recommendations Home health PT;Supervision - Intermittent    Equipment Recommendations  Rolling walker with 5" wheels;3in1 (PT)    Recommendations for Other Services       Precautions / Restrictions Precautions Precautions: Knee Precaution Booklet Issued: Yes (comment) Restrictions Weight Bearing Restrictions: Yes RLE Weight Bearing: Weight bearing as tolerated      Mobility  Bed Mobility Overal bed mobility: Modified Independent             General bed mobility comments: HOB slightly elevated  Transfers Overall transfer level: Needs assistance Equipment used: Rolling walker (2 wheeled) Transfers: Sit to/from Stand Sit to Stand: Supervision         General transfer comment: cues for hand placement  Ambulation/Gait Ambulation/Gait assistance: Min guard Ambulation Distance (Feet): 45 Feet Assistive device: Rolling walker (2 wheeled) Gait Pattern/deviations: Step-through pattern;Decreased stance time - right;Antalgic Gait velocity: decreased Gait velocity interpretation: Below normal speed for age/gender General Gait Details: Cues for heel down which improved as gait progressed. Antalgic pattern.  No dizziness.  Stairs             Wheelchair Mobility    Modified Rankin (Stroke Patients Only)       Balance Overall balance assessment: Needs assistance           Standing balance-Leahy Scale: Poor Standing balance comment: requires UE support with dynamic activities. Static balance fair                             Pertinent Vitals/Pain Pain Assessment: 0-10 Pain Score: 8  Pain Location: knee Pain Descriptors / Indicators: Throbbing;Burning Pain Intervention(s): Monitored during session;Patient requesting pain meds-RN notified;Limited activity within patient's tolerance;Repositioned;RN gave pain meds during session;Ice applied    Home Living Family/patient expects to be discharged to:: Private residence Living Arrangements: Spouse/significant other Available Help at Discharge: Friend(s);Available 24 hours/day Type of Home: Mobile home Home Access: Stairs to enter Entrance Stairs-Rails: Doctor, general practice of Steps: 4 Home Layout: One level Home Equipment: Cane - single point Additional Comments: Normally lives with mother (who is blind) and step- father who has had strokes. Plan is to d/c to girlfriend's house in [place]. She is a Lawyer.    Prior Function Level of Independence: Independent               Hand Dominance   Dominant Hand: Right    Extremity/Trunk Assessment   Upper Extremity Assessment Upper Extremity Assessment: Overall WFL for tasks assessed    Lower Extremity Assessment Lower Extremity Assessment: RLE deficits/detail RLE Deficits / Details: good quad set -8- 77 degrees ROM       Communication   Communication: No difficulties  Cognition Arousal/Alertness: Awake/alert Behavior During  Therapy: WFL for tasks assessed/performed Overall Cognitive Status: Within Functional Limits for tasks assessed                                        General Comments      Exercises Total Joint Exercises Ankle Circles/Pumps:  AROM;Both;10 reps;Supine Quad Sets: Both;10 reps;Supine Heel Slides: AROM;AAROM;Right;10 reps Goniometric ROM: -8 to 77   Assessment/Plan    PT Assessment Patient needs continued PT services  PT Problem List Decreased strength;Decreased range of motion;Decreased activity tolerance;Decreased balance;Decreased mobility;Decreased knowledge of use of DME;Pain       PT Treatment Interventions DME instruction;Gait training;Stair training;Functional mobility training;Therapeutic activities;Therapeutic exercise;Balance training;Patient/family education    PT Goals (Current goals can be found in the Care Plan section)  Acute Rehab PT Goals Patient Stated Goal: decrease pain PT Goal Formulation: With patient Time For Goal Achievement: 03/06/17 Potential to Achieve Goals: Good    Frequency 7X/week   Barriers to discharge        Co-evaluation               End of Session Equipment Utilized During Treatment: Gait belt Activity Tolerance: Patient tolerated treatment well Patient left: in chair;with call bell/phone within reach Nurse Communication: Mobility status PT Visit Diagnosis: Difficulty in walking, not elsewhere classified (R26.2);Pain Pain - Right/Left: Right Pain - part of body: Knee    Time: 0910 (no charge for time getting pain meds and using urinal)-0951 PT Time Calculation (min) (ACUTE ONLY): 41 min   Charges:   PT Evaluation $PT Eval Moderate Complexity: 1 Procedure PT Treatments $Gait Training: 8-22 mins   PT G Codes:        Nancyann Cotterman L. Katrinka Blazing, [place] Pager [phone] 02/27/2017   Keith Garza 02/27/2017, 10:55 AM

## 2017-02-27 NOTE — Progress Notes (Signed)
   Subjective:  Patient reports pain as moderate.   Objective:   VITALS:   Vitals:   02/26/17 1422 02/26/17 1445 02/26/17 2123 02/27/17 0630  BP: (!) 157/103 (!) 149/92 (!) 129/58 110/61  Pulse: (!) 52 63 89 79  Resp: Temp:  97.9 F (36.6 C) 98.3 F (36.8 C) 98.5 F (36.9 C)  TempSrc:  Oral Oral Oral  SpO2: 100% 99% 99% 99%    Neurologically intact Neurovascular intact Sensation intact distally Intact pulses distally Dorsiflexion/Plantar flexion intact Incision: dressing C/D/I and no drainage No cellulitis present Compartment soft   Lab Results  Component Value Date   WBC 7.2 02/21/2017   HGB 14.2 02/21/2017   HCT 44.2 02/21/2017   MCV 85.2 02/21/2017   PLT 219 02/21/2017     Assessment/Plan:  1 Day Post-Op   - Expected postop acute blood loss anemia - will monitor for symptoms - Up with PT/OT - DVT ppx - SCDs, ambulation, aspirin - WBAT operative extremity - Pain control - Discharge planning - likely dc home friday  Glee Arvin 02/27/2017, 7:28 AM [phone]

## 2017-02-27 NOTE — Progress Notes (Signed)
Occupational Therapy Evaluation Patient Details Name: Keith Garza MRN: [id number] DOB: February 14, 1966 Today's Date: 02/27/2017    History of Present Illness 51 y/o male s/p R TKA (02/26/17).  PMH includes arthritis, cataracts, depression, GERD, HTN   Clinical Impression   Completed all education regarding compensatory techniques for ADL and functional mobility for shower transfers with use of RW and 3 in 1. Girlfriend present and able to return demonstrate with pt. Pt will be safe to DC home tomorrow with intermittent of girlfriend when medically stable. OT signing off.     Follow Up Recommendations  No OT follow up;Supervision - Intermittent    Equipment Recommendations  3 in 1 bedside commode    Recommendations for Other Services       Precautions / Restrictions Precautions Precautions: Knee Precaution Booklet Issued: Yes (comment) Precaution Comments: reviewed with pt and girlfriend Restrictions Weight Bearing Restrictions: Yes RLE Weight Bearing: Weight bearing as tolerated      Mobility Bed Mobility Overal bed mobility: Modified Independent   Transfers Overall transfer level: Needs assistance Equipment used: Rolling walker (2 wheeled) Transfers: Sit to/from Stand Sit to Stand: Supervision         General transfer comment: cues for hand placement    Balance Overall balance assessment: Needs assistance   Sitting balance-Leahy Scale: Good       Standing balance-Leahy Scale: Fair Standing balance comment: requires UE support                           ADL either performed or assessed with clinical judgement   ADL Overall ADL's : Needs assistance/impaired         Upper Body Bathing: Set up;Sitting   Lower Body Bathing: Minimal assistance;Sit to/from stand   Upper Body Dressing : Set up;Sitting   Lower Body Dressing: Minimal assistance;Sit to/from stand   Toilet Transfer: Supervision/safety;Ambulation   Toileting- Clothing  Manipulation and Hygiene: Supervision/safety       Functional mobility during ADLs: Supervision/safety;Rolling walker;Cueing for safety General ADL Comments: Pt educated on compensatory technqieus for LB ADL. Pt verbalizecd understanding. Educated on walk in shower transfer technique. girlfriend able to assist as needed.      Vision         Perception     Praxis      Pertinent Vitals/Pain Pain Assessment: Faces Pain Score: 8  Faces Pain Scale: Hurts little more Pain Location: knee Pain Descriptors / Indicators: Throbbing;Burning;Discomfort;Grimacing Pain Intervention(s): Limited activity within patient's tolerance;Repositioned;Ice applied     Hand Dominance Right   Extremity/Trunk Assessment Upper Extremity Assessment Upper Extremity Assessment: Overall WFL for tasks assessed   Lower Extremity Assessment Lower Extremity Assessment: Defer to PT evaluation    Cervical / Trunk Assessment Cervical / Trunk Assessment: Normal   Communication Communication Communication: No difficulties   Cognition Arousal/Alertness: Awake/alert Behavior During Therapy: WFL for tasks assessed/performed Overall Cognitive Status: Within Functional Limits for tasks assessed                                     General Comments    Exercises   Shoulder Instructions      Home Living Family/patient expects to be discharged to:: Private residence Living Arrangements: Parent Available Help at Discharge: Friend(s);Available 24 hours/day Type of Home: Mobile home Home Access: Stairs to enter Entrance Stairs-Number of Steps: 4 Entrance Stairs-Rails: Right;Left Home Layout: One  level     Bathroom Shower/Tub: Producer, television/film/video: Handicapped height Bathroom Accessibility: Yes How Accessible: Accessible via walker Home Equipment: Cane - single point   Additional Comments: Normally lives with mother (who is blind) and step- father who has had strokes. Plan is  to d/c to girlfriend's house in [place]. She is a Lawyer.      Prior Functioning/Environment Level of Independence: Independent                 OT Problem List: Decreased strength;Decreased range of motion;Decreased safety awareness;Decreased knowledge of use of DME or AE;Decreased knowledge of precautions;Pain      OT Treatment/Interventions:      OT Goals(Current goals can be found in the care plan section) Acute Rehab OT Goals Patient Stated Goal: decrease pain OT Goal Formulation: All assessment and education complete, DC therapy  OT Frequency:     Barriers to D/C:            Co-evaluation              End of Session Equipment Utilized During Treatment: Rolling walker CPM Right Knee CPM Right Knee: Off  Activity Tolerance: Patient tolerated treatment well Patient left: in bed;in CPM;with family/visitor present  OT Visit Diagnosis: Unsteadiness on feet (R26.81);Other abnormalities of gait and mobility (R26.89);Pain Pain - Right/Left: Right Pain - part of body: Knee                Time: 4782-9562 OT Time Calculation (min): 25 min Charges:  OT General Charges $OT Visit: 1 Procedure OT Evaluation $OT Eval Low Complexity: 1 Procedure OT Treatments $Self Care/Home Management : 8-22 mins G-Codes:     [facility], OT/L  [phone] 02/27/2017  Keith Garza,Keith Garza 02/27/2017, 3:58 PM

## 2017-02-28 ENCOUNTER — Encounter (HOSPITAL_COMMUNITY): Payer: Self-pay | Admitting: Orthopaedic Surgery

## 2017-02-28 NOTE — Care Management Note (Signed)
Case Management Note  Patient Details  Name: Keith Garza MRN: [id number] Date of Birth: [birth year]  Subjective/Objective:  51 yr old male s/p right total knee revision.                   Action/Plan: Case manager has discussed Home Health and DME needs with patient. He is under [employer] so CM has contacted Lupita Leash at the [facility] to arrange [facility] therapy and DME. CM faxed orders, OP note and demographics to her at [phone].  02/28/17 CM spoke with Lupita Leash who provided a number for [place] at the [place] [phone], to discuss getting his DME. CM left message. Shortly thereafter CM received a call from Kevin,RN at the [place] who stated that they have authorized [facility]  for DME. CM called [facility] liaison to arrange. Patient is very upset because he has been making calls to arrange things and it has not worked out. Patient will be going to his girlfriends place in [place], [place] [address]  [address], [place], [place]   [phone].       Expected Discharge Date:  02/28/17               Expected Discharge Plan:   Home with Home Health  In-House Referral:     Discharge planning Services  CM Consult  Post Acute Care Choice:  Durable Medical Equipment, Home Health Choice offered to:   (Patient is under [employer])  DME Arranged:  3-N-1, Walker rolling DME Agency:  [facility]  HH Arranged:  PT HH Agency:  [facility]  Status of Service:  In process, will continue to follow  If discussed at Long Length of Stay Meetings, dates discussed:    Additional Comments:  Durenda Guthrie, RN 02/28/2017, 11:01 AM

## 2017-02-28 NOTE — Discharge Summary (Signed)
Physician Discharge Summary      Patient ID: Keith Garza MRN: [id number] DOB/AGE: [birth year] 51 y.o.  Admit date: 02/26/2017 Discharge date: 02/28/2017  Admission Diagnoses:  <principal problem not specified>  Discharge Diagnoses:  Active Problems:   Total knee replacement status   Past Medical History:  Diagnosis Date  . Arthritis   . Cataract    left eye   to have surgery in may 18  . Complication of anesthesia    "quit breathing twice during last surgery"-told might have sleep apnea  . Depression   . GERD (gastroesophageal reflux disease)   . Hypertension     Surgeries: Procedure(s): RIGHT TOTAL KNEE REVISION on 02/26/2017   Consultants (if any):   Discharged Condition: Improved  Hospital Course: NATIVIDAD HALLS is an 51 y.o. male who was admitted 02/26/2017 with a diagnosis of <principal problem not specified> and went to the operating room on 02/26/2017 and underwent the above named procedures.    He was given perioperative antibiotics:  Anti-infectives    Start     Dose/Rate Route Frequency Ordered Stop   02/26/17 1500  ceFAZolin (ANCEF) IVPB 2g/100 mL premix     2 g 200 mL/hr over 30 Minutes Intravenous Every 6 hours 02/26/17 1451 02/27/17 0110   02/26/17 0800  ceFAZolin (ANCEF) IVPB 2g/100 mL premix     2 g 200 mL/hr over 30 Minutes Intravenous To ShortStay Surgical 02/25/17 0859 02/26/17 0853    .  He was given sequential compression devices, early ambulation, and aspirin for DVT prophylaxis.  He benefited maximally from the hospital stay and there were no complications.    Recent vital signs:  Vitals:   02/27/17 2054 02/28/17 0454  BP: (!) 160/85 (!) 146/90  Pulse: (!) 109 85  Resp: 18 20  Temp: 98.9 F (37.2 C) 98.2 F (36.8 C)    Recent laboratory studies:  Lab Results  Component Value Date   HGB 9.8 (L) 02/27/2017   HGB 14.2 02/21/2017   HGB 11.1 (L) 02/04/2015   Lab Results  Component Value Date   WBC 5.8 02/27/2017   PLT 148  (L) 02/27/2017   Lab Results  Component Value Date   INR 0.95 02/21/2017   Lab Results  Component Value Date   NA 141 02/27/2017   K 3.6 02/27/2017   CL 108 02/27/2017   CO2 29 02/27/2017   BUN 5 (L) 02/27/2017   CREATININE 0.78 02/27/2017   GLUCOSE 112 (H) 02/27/2017    Discharge Medications:   Allergies as of 02/28/2017      Reactions   No Known Allergies       Medication List    TAKE these medications   aspirin EC 325 MG tablet Take 1 tablet (325 mg total) by mouth 2 (two) times daily.   cephALEXin 500 MG capsule Commonly known as:  KEFLEX Take 1 capsule (500 mg total) by mouth every 8 (eight) hours.   cetirizine 10 MG tablet Commonly known as:  ZYRTEC Take 10 mg by mouth daily.   divalproex 500 MG DR tablet Commonly known as:  DEPAKOTE Take 500 mg by mouth every evening.   fish oil-omega-3 fatty acids 1000 MG capsule Take 1 g by mouth 2 (two) times a week.   fluticasone 50 MCG/ACT nasal spray Commonly known as:  FLONASE Place 1 spray into both nostrils daily as needed for allergies or rhinitis.   gabapentin 300 MG capsule Commonly known as:  NEURONTIN Take 600 mg by mouth  3 (three) times daily.   ibuprofen 200 MG tablet Commonly known as:  ADVIL,MOTRIN Take 400 mg by mouth every 6 (six) hours as needed for mild pain.   indomethacin 50 MG capsule Commonly known as:  INDOCIN Take 1 capsule (50 mg total) by mouth 3 (three) times daily with meals. What changed:  when to take this  reasons to take this   lisinopril 40 MG tablet Commonly known as:  PRINIVIL,ZESTRIL Take 20 mg by mouth daily.   lurasidone 80 MG Tabs tablet Commonly known as:  LATUDA Take 80 mg by mouth at bedtime.   methocarbamol 750 MG tablet Commonly known as:  ROBAXIN Take 1 tablet (750 mg total) by mouth 2 (two) times daily as needed for muscle spasms.   mirtazapine 30 MG tablet Commonly known as:  REMERON Take 30 mg by mouth at bedtime.   multivitamin with minerals  Tabs tablet Take 1 tablet by mouth daily.   omeprazole 20 MG capsule Commonly known as:  PRILOSEC Take 20 mg by mouth 2 (two) times daily.   ondansetron 4 MG tablet Commonly known as:  ZOFRAN Take 1-2 tablets (4-8 mg total) by mouth every 8 (eight) hours as needed for nausea or vomiting.   oxyCODONE 5 MG immediate release tablet Commonly known as:  Oxy IR/ROXICODONE Take 1-3 tablets (5-15 mg total) by mouth every 4 (four) hours as needed.   oxyCODONE 10 mg 12 hr tablet Commonly known as:  OXYCONTIN Take 1 tablet (10 mg total) by mouth every 12 (twelve) hours.   oxyCODONE-acetaminophen 5-325 MG tablet Commonly known as:  PERCOCET/ROXICET Take 2 tablets by mouth every 8 (eight) hours as needed for severe pain.   promethazine 25 MG tablet Commonly known as:  PHENERGAN Take 1 tablet (25 mg total) by mouth every 6 (six) hours as needed for nausea.   senna-docusate 8.6-50 MG tablet Commonly known as:  SENOKOT S Take 1 tablet by mouth at bedtime as needed.   traMADol 50 MG tablet Commonly known as:  ULTRAM Take 1 tablet (50 mg total) by mouth every 6 (six) hours as needed for moderate pain.            Durable Medical Equipment        Start     Ordered   02/26/17 1452  DME Walker rolling  Once    Question:  Patient needs a walker to treat with the following condition  Answer:  Total knee replacement status   02/26/17 1451   02/26/17 1452  DME 3 n 1  Once     02/26/17 1451   02/26/17 1452  DME Bedside commode  Once    Question:  Patient needs a bedside commode to treat with the following condition  Answer:  Total knee replacement status   02/26/17 1451      Diagnostic Studies: Dg Knee Right Port  Result Date: 02/26/2017 CLINICAL DATA:  Status post right total knee arthroplasty EXAM: PORTABLE RIGHT KNEE - 1-2 VIEW COMPARISON:  01/24/2017 right knee radiographs FINDINGS: Status post removal of medial compartment hardware with interval right total knee arthroplasty with  well-positioned distal femoral, posterior patellar and proximal tibial prostheses with no evidence of hardware fracture or loosening. No osseous fracture. Stable healed deformities in the proximal right fibula and proximal right tibia. No suspicious focal osseous lesions. No right knee dislocation. Expected postoperative gas within the right knee joint and surrounding the right knee and distal right femur. IMPRESSION: Satisfactory immediate postoperative appearance status post  right total knee arthroplasty. Electronically Signed   By: Delbert Phenix M.D.   On: 02/26/2017 12:49    Disposition: 01-Home or Self Care  Discharge Instructions    Call MD / Call 911    Complete by:  As directed    If you experience chest pain or shortness of breath, CALL 911 and be transported to the hospital emergency room.  If you develope a fever above 101.5 F, pus (white drainage) or increased drainage or redness at the wound, or calf pain, call your surgeon's office.   Constipation Prevention    Complete by:  As directed    Drink plenty of fluids.  Prune juice may be helpful.  You may use a stool softener, such as Colace (over the counter) 100 mg twice a day.  Use MiraLax (over the counter) for constipation as needed.   Driving restrictions    Complete by:  As directed    No driving while taking narcotic pain meds.   Increase activity slowly as tolerated    Complete by:  As directed       Follow-up Information    Glee Arvin, MD In 2 weeks.   Specialty:  Orthopedic Surgery Why:  For suture removal, For wound re-check Contact information: [address] [place] [address] [phone]            Signed: Glee Arvin 02/28/2017, 7:40 AM

## 2017-02-28 NOTE — Progress Notes (Signed)
   Subjective:  Patient reports pain as mild  Objective:   VITALS:   Vitals:   02/27/17 1410 02/27/17 1751 02/27/17 2054 02/28/17 0454  BP: (!) 148/72  (!) 160/85 (!) 146/90  Pulse: 90  (!) 109 85  Resp: Temp: 98.6 F (37 C)  98.9 F (37.2 C) 98.2 F (36.8 C)  TempSrc: Oral  Oral Oral  SpO2: 97%  99% 98%  Weight:  178 lb 4.8 oz (80.9 kg)    Height:   (1.753 m)      Neurologically intact Neurovascular intact Sensation intact distally Intact pulses distally Dorsiflexion/Plantar flexion intact Incision: dressing C/D/I and no drainage No cellulitis present Compartment soft   Lab Results  Component Value Date   WBC 5.8 02/27/2017   HGB 9.8 (L) 02/27/2017   HCT 31.6 (L) 02/27/2017   MCV 87.3 02/27/2017   PLT 148 (L) 02/27/2017     Assessment/Plan:  2 Days Post-Op   - stair training today - patient will be living with girlfriend in [place] so will need HHPT to see him out there - dc home today after PT  Glee Arvin 02/28/2017, 7:39 AM [phone]

## 2017-02-28 NOTE — Care Management (Signed)
Case manager received a call [facility] to [facility] that [facility] has received his D[facility] and has been setup for Home Health therapy. Misty Stanley apologized for confusion, states she will contact patient as well. CM contacted Brad with [facility] and informed him that [facility] and [facility].

## 2017-02-28 NOTE — Progress Notes (Signed)
Physical Therapy Treatment Patient Details Name: Keith Garza MRN: [id number] DOB: 10/13/1966 Today's Date: 02/28/2017    History of Present Illness 51 y/o male s/p R TKA (02/26/17).  PMH includes arthritis, cataracts, depression, GERD, HTN    PT Comments    Patient doing well with mobility and gait.  Able to negotiate 3 stairs with supervision.  Patient ready for discharge from PT perspective - RN notified.   Follow Up Recommendations  Home health PT;Supervision - Intermittent     Equipment Recommendations  Rolling walker with 5" wheels;3in1 (PT)    Recommendations for Other Services       Precautions / Restrictions Precautions Precautions: Knee Precaution Comments: Reviewed with patient Restrictions Weight Bearing Restrictions: Yes RLE Weight Bearing: Weight bearing as tolerated    Mobility  Bed Mobility Overal bed mobility: Modified Independent             General bed mobility comments: Increased time  Transfers Overall transfer level: Needs assistance Equipment used: Rolling walker (2 wheeled) Transfers: Sit to/from Stand Sit to Stand: Supervision         General transfer comment: Cues and assist for safety.  Ambulation/Gait Ambulation/Gait assistance: Supervision Ambulation Distance (Feet): 200 Feet Assistive device: Rolling walker (2 wheeled) Gait Pattern/deviations: Step-through pattern;Decreased stance time - right;Antalgic Gait velocity: decreased Gait velocity interpretation: Below normal speed for age/gender General Gait Details: Pt c/o feeling like joint is loose with WB.  Reports the MD knows.   Stairs Stairs: Yes   Stair Management: Two rails;Step to pattern;Forwards Number of Stairs: 3 General stair comments: Verbal cues on step-to technique.  Patient able to perform with supervision only.  Wheelchair Mobility    Modified Rankin (Stroke Patients Only)       Balance             Standing balance-Leahy Scale: Fair                               Cognition Arousal/Alertness: Awake/alert Behavior During Therapy: Agitated;Anxious Overall Cognitive Status: Within Functional Limits for tasks assessed                                 General Comments: Patient upset about getting his meds without an IV, and about getting a RW.  CM and RN took care of these issues.      Exercises      General Comments        Pertinent Vitals/Pain Pain Assessment: 0-10 Pain Score: 9  Pain Location: Rt knee Pain Descriptors / Indicators: Aching;Throbbing Pain Intervention(s): Monitored during session;Repositioned;Patient requesting pain meds-RN notified;Ice applied    Home Living                      Prior Function            PT Goals (current goals can now be found in the care plan section) Acute Rehab PT Goals Patient Stated Goal: decrease pain Progress towards PT goals: Progressing toward goals    Frequency    7X/week      PT Plan Current plan remains appropriate    Co-evaluation             End of Session Equipment Utilized During Treatment: Gait belt Activity Tolerance: Patient tolerated treatment well Patient left: in bed;with call bell/phone within reach;with nursing/sitter in room Nurse Communication:  Mobility status;Patient requests pain meds PT Visit Diagnosis: Difficulty in walking, not elsewhere classified (R26.2);Pain Pain - Right/Left: Right Pain - part of body: Knee     Time: 1610-9604 PT Time Calculation (min) (ACUTE ONLY): 25 min  Charges:  $Gait Training: 23-37 mins                    G Codes:       Durenda Hurt. Renaldo Fiddler, [facility] Acute Rehab Services Pager [phone]    Vena Austria 02/28/2017, 11:16 AM

## 2017-02-28 NOTE — Progress Notes (Addendum)
Pt given discharge instructions and prescriptions given to patient and reviewed. All questions answered. DME equipment at bedside. Prescriptions faxed to Ashok Cordia RN at the [place] [phone] per [facility] and [facility] request and confirmation sheet  Received. Barbera Setters RN

## 2017-03-11 ENCOUNTER — Telehealth (INDEPENDENT_AMBULATORY_CARE_PROVIDER_SITE_OTHER): Payer: Self-pay | Admitting: Orthopaedic Surgery

## 2017-03-11 MED ORDER — OXYCODONE-ACETAMINOPHEN 5-325 MG PO TABS: 2.0000 | ORAL_TABLET | Freq: Three times a day (TID) | ORAL | 0 refills | Status: DC | PRN

## 2017-03-11 NOTE — Addendum Note (Signed)
Addended by: Albertina ParrGARCIA, Yusuf Yu on: 03/11/2017 03:06 PM   Modules accepted: Orders

## 2017-03-11 NOTE — Telephone Encounter (Signed)
Patient called asking for a refill on oxycodone. CB # [phone]

## 2017-03-11 NOTE — Telephone Encounter (Signed)
30

## 2017-03-11 NOTE — Telephone Encounter (Signed)
Rx ready for pick up at the front desk. Patient aware and would like to pickup on his appt sate on Thursday.

## 2017-03-11 NOTE — Telephone Encounter (Signed)
See message below °

## 2017-03-13 ENCOUNTER — Ambulatory Visit (INDEPENDENT_AMBULATORY_CARE_PROVIDER_SITE_OTHER): Payer: Self-pay | Admitting: Orthopaedic Surgery

## 2017-03-13 ENCOUNTER — Encounter (INDEPENDENT_AMBULATORY_CARE_PROVIDER_SITE_OTHER): Payer: Self-pay | Admitting: Orthopaedic Surgery

## 2017-03-13 ENCOUNTER — Encounter (INDEPENDENT_AMBULATORY_CARE_PROVIDER_SITE_OTHER): Payer: Self-pay

## 2017-03-13 DIAGNOSIS — M1711 Unilateral primary osteoarthritis, right knee: Secondary | ICD-10-CM

## 2017-03-13 NOTE — Progress Notes (Signed)
Patient is 2 weeks status post conversion of partial knee replacement to total knee replacement. He endorses throbbing burning tendinitis and pain at times. He is doing in-home therapy right now. He's got a lot of social issues going on. The surgical scar is healed. No signs of infection. Range of motion 0-85. I refilled his Percocet. Overall he looks good. He has expected postoperative swelling. Prescription for outpatient physical therapy was given. I will see him back in 4 weeks with 2 view x-rays of the right knee. Tinea aspirin for DVT prophylaxis.

## 2017-04-10 ENCOUNTER — Ambulatory Visit (INDEPENDENT_AMBULATORY_CARE_PROVIDER_SITE_OTHER): Payer: Non-veteran care | Admitting: Orthopaedic Surgery

## 2017-04-10 ENCOUNTER — Telehealth (INDEPENDENT_AMBULATORY_CARE_PROVIDER_SITE_OTHER): Payer: Self-pay

## 2017-04-10 MED ORDER — HYDROCODONE-ACETAMINOPHEN 5-325 MG PO TABS: 1.0000 | ORAL_TABLET | Freq: Three times a day (TID) | ORAL | 0 refills | Status: DC | PRN

## 2017-04-10 NOTE — Telephone Encounter (Signed)
Patient called stating that he has called to schedule an appointment with Physical Therapy and [facility] Specialist in [place], but no one has returned his call to make patient an appointment.  He would like to know what needs to be done concerning this issue?  Would like to have a Rx for pain?  CB# is [phone]. Please advise.  Thank You.

## 2017-04-10 NOTE — Telephone Encounter (Signed)
Rx printed pending signature will be ready for pick up in our office tomorrow morning

## 2017-04-10 NOTE — Addendum Note (Signed)
Addended by: Albertina ParrGARCIA, Naamah Boggess on: 04/10/2017 04:29 PM   Modules accepted: Orders

## 2017-04-10 NOTE — Telephone Encounter (Signed)
Please advise on Rx, states he would like Rx for pain.

## 2017-04-10 NOTE — Telephone Encounter (Signed)
FYI:   Called patient to let him know Rx will be ready for pick up tomorrow morning and he does have a scheduled appt with PT hand and specialist  June 12 @ 11:30, patient aware.

## 2017-04-10 NOTE — Telephone Encounter (Signed)
Please call the office and figure out why. Thanks.  [place]co #[phone]

## 2018-01-14 ENCOUNTER — Emergency Department (HOSPITAL_COMMUNITY)
Admission: EM | Admit: 2018-01-14 | Discharge: 2018-01-14 | Disposition: A | Discharge status: Home / Self Care | Payer: Non-veteran care | Attending: Emergency Medicine | Admitting: Emergency Medicine

## 2018-01-14 ENCOUNTER — Encounter (HOSPITAL_COMMUNITY): Payer: Self-pay | Admitting: Emergency Medicine

## 2018-01-14 ENCOUNTER — Other Ambulatory Visit: Payer: Self-pay

## 2018-01-14 DIAGNOSIS — Z5321 Procedure and treatment not carried out due to patient leaving prior to being seen by health care provider: Secondary | ICD-10-CM | POA: Diagnosis not present

## 2018-01-14 DIAGNOSIS — R030 Elevated blood-pressure reading, without diagnosis of hypertension: Secondary | ICD-10-CM | POA: Diagnosis not present

## 2018-01-14 NOTE — ED Notes (Signed)
Per Registration pt came to desk stating he was leaving. Pt not in waiting area at this time.

## 2018-01-14 NOTE — ED Triage Notes (Signed)
Pt reports HTN readings at home for several weeks, [place] [facility] [place]. Recently changed BP medication 4 days ago. Denies N/V/, dizziness, or vision changes.

## 2018-01-15 ENCOUNTER — Emergency Department (HOSPITAL_COMMUNITY)
Admission: EM | Admit: 2018-01-15 | Discharge: 2018-01-15 | Disposition: A | Discharge status: Home / Self Care | Payer: Non-veteran care | Attending: Emergency Medicine | Admitting: Emergency Medicine

## 2018-01-15 ENCOUNTER — Encounter (HOSPITAL_COMMUNITY): Payer: Self-pay | Admitting: Emergency Medicine

## 2018-01-15 ENCOUNTER — Emergency Department (HOSPITAL_COMMUNITY): Payer: Non-veteran care

## 2018-01-15 DIAGNOSIS — F17210 Nicotine dependence, cigarettes, uncomplicated: Secondary | ICD-10-CM | POA: Insufficient documentation

## 2018-01-15 DIAGNOSIS — Z79899 Other long term (current) drug therapy: Secondary | ICD-10-CM | POA: Insufficient documentation

## 2018-01-15 DIAGNOSIS — I10 Essential (primary) hypertension: Secondary | ICD-10-CM

## 2018-01-15 HISTORY — DX: Post-traumatic stress disorder, unspecified: F43.10

## 2018-01-15 HISTORY — DX: Bipolar disorder, unspecified: F31.9

## 2018-01-15 LAB — TROPONIN I

## 2018-01-15 LAB — CBC WITH DIFFERENTIAL/PLATELET
BASOS PCT: 0 %
Basophils Absolute: 0 10*3/uL (ref 0.0–0.1)
EOS ABS: 0.1 10*3/uL (ref 0.0–0.7)
EOS PCT: 2 %
HCT: 40 % (ref 39.0–52.0)
Hemoglobin: 12.6 g/dL — ABNORMAL LOW (ref 13.0–17.0)
Lymphocytes Relative: 37 %
Lymphs Abs: 1.7 10*3/uL (ref 0.7–4.0)
MCH: 27.2 pg (ref 26.0–34.0)
MCHC: 31.5 g/dL (ref 30.0–36.0)
MCV: 86.2 fL (ref 78.0–100.0)
MONO ABS: 0.4 10*3/uL (ref 0.1–1.0)
Monocytes Relative: 9 %
NEUTROS PCT: 52 %
Neutro Abs: 2.4 10*3/uL (ref 1.7–7.7)
PLATELETS: 199 10*3/uL (ref 150–400)
RBC: 4.64 MIL/uL (ref 4.22–5.81)
RDW: 14.8 % (ref 11.5–15.5)
WBC: 4.7 10*3/uL (ref 4.0–10.5)

## 2018-01-15 LAB — BASIC METABOLIC PANEL
Anion gap: 10 (ref 5–15)
BUN: 14 mg/dL (ref 6–20)
CALCIUM: 9.3 mg/dL (ref 8.9–10.3)
CO2: 26 mmol/L (ref 22–32)
CREATININE: 0.81 mg/dL (ref 0.61–1.24)
Chloride: 104 mmol/L (ref 101–111)
GFR calc non Af Amer: >60 mL/min (ref >60)
Glucose, Bld: 100 mg/dL — ABNORMAL HIGH (ref 65–99)
Potassium: 3.9 mmol/L (ref 3.5–5.1)
SODIUM: 140 mmol/L (ref 135–145)

## 2018-01-15 MED ORDER — METOPROLOL TARTRATE 5 MG/5ML IV SOLN
INTRAVENOUS | Status: AC
  Administered 2018-01-15: 5 mg
  Filled 2018-01-15: qty 5

## 2018-01-15 MED ORDER — METOPROLOL TARTRATE 5 MG/5ML IV SOLN
5.0000 mg | Freq: Once | INTRAVENOUS | Status: AC
  Administered 2018-01-15: 5 mg via INTRAVENOUS
  Filled 2018-01-15: qty 5

## 2018-01-15 NOTE — Discharge Instructions (Signed)
Take one of your metroprol tablets (25 mg a whole tablet) twice a day in addition to your lisinopril.  Follow-up with your primary provider or return here for any worsening symptoms

## 2018-01-15 NOTE — ED Notes (Signed)
Pt currently discussing how much stress he is under currently. Stating his mom has breast cancer, [facility], and he is having issues with girls. Notified pt that stress can significantly increase BP.

## 2018-01-15 NOTE — ED Notes (Signed)
Patient transported to X-ray 

## 2018-01-15 NOTE — ED Triage Notes (Signed)
Pt reports he has been having trouble with his blood pressure increasing over the past few weeks.  Had med changes about 4 weeks ago.  Denies any associated symptoms.  States he has a lot of anxiety and is smoking and drinking are increasing.

## 2018-01-17 NOTE — ED Provider Notes (Signed)
[facility] EMERGENCY DEPARTMENT Provider Note   CSN: [id number] Arrival date & time: 01/15/18  [id number]     History   Chief Complaint Chief Complaint  Patient presents with  . Hypertension    HPI Dimple CaseyRoger D Wessinger is a 52 y.o. male.  HPI   Dimple CaseyRoger D Pinela is a 52 y.o. male who presents to the Emergency Department complaining of elevated blood pressure for several weeks.  He also reports increased stress and anxiety due to home situations.  States that he is recently been seen at the [facility] and his blood pressure medications has been changed.  He denies chest pain, shortness of breath, pain or swelling of the lower extremities, jaw pain, headache or visual changes.  He does admit to increase cigarette smoking and alcohol use due to his stressors.  He denies SI or HI   Past Medical History:  Diagnosis Date  . Arthritis   . Bipolar disorder (HCC)   . Cataract    left eye   to have surgery in may 18  . Complication of anesthesia    "quit breathing twice during last surgery"-told might have sleep apnea  . Depression   . GERD (gastroesophageal reflux disease)   . Hypertension   . PTSD (post-traumatic stress disorder)     Patient Active Problem List   Diagnosis Date Noted  . Total knee replacement status 02/26/2017  . Unilateral primary osteoarthritis, right knee 01/23/2017  . Acute gout 02/04/2015  . Cellulitis of left foot 02/03/2015    Past Surgical History:  Procedure Laterality Date  . FOOT SURGERY Left    fx middle toe  . KNEE ARTHROPLASTY Right 2008   medial knee  . REVISION TOTAL KNEE ARTHROPLASTY Right 02/26/2017  . SPERMATOCELECTOMY Left 2009  . TOTAL KNEE REVISION Right 02/26/2017   Procedure: RIGHT TOTAL KNEE REVISION;  Surgeon: Tarry KosNaiping M Xu, MD;  Location: [facility] OR;  Service: Orthopedics;  Laterality: Right;       Home Medications    Prior to Admission medications   Medication Sig Start Date End Date Taking? Authorizing Provider  acetaminophen (TYLENOL)  325 MG tablet Take 650 mg by mouth every 6 (six) hours as needed for moderate pain.   Yes [provider]  cetirizine (ZYRTEC) 10 MG tablet Take 10 mg by mouth daily.    Yes [provider]  Cholecalciferol (VITAMIN D PO) Take 4 tablets by mouth daily.   Yes [provider]  cyclobenzaprine (FLEXERIL) 10 MG tablet Take 10 mg by mouth daily as needed for muscle spasms.   Yes [provider]  divalproex (DEPAKOTE) 500 MG DR tablet Take 500 mg by mouth every evening.    Yes [provider]  fish oil-omega-3 fatty acids 1000 MG capsule Take 1 g by mouth 2 (two) times a week.    Yes [provider]  fluticasone (FLONASE) 50 MCG/ACT nasal spray Place 2 sprays into both nostrils 2 (two) times daily.    Yes [provider]  gabapentin (NEURONTIN) 300 MG capsule Take 300 mg by mouth daily.    Yes [provider]  HYDROcodone-acetaminophen (NORCO/VICODIN) 5-325 MG tablet Take 1 tablet by mouth every 8 (eight) hours as needed for moderate pain. 04/10/17  Yes Tarry KosXu, Naiping M, MD  ibuprofen (ADVIL,MOTRIN) 200 MG tablet Take 400 mg by mouth every 6 (six) hours as needed for mild pain.   Yes [provider]  indomethacin (INDOCIN) 50 MG capsule Take 1 capsule (50 mg total) by  mouth 3 (three) times daily with meals. Patient taking differently: Take 50 mg by mouth 3 (three) times daily as needed for mild pain.  02/06/15  Yes Standley Brooking, MD  lisinopril (PRINIVIL,ZESTRIL) 40 MG tablet Take 20 mg by mouth 2 (two) times daily.    Yes [provider]  lurasidone (LATUDA) 80 MG TABS tablet Take 80 mg by mouth at bedtime.   Yes [provider]  metoprolol tartrate (LOPRESSOR) 25 MG tablet Take 12.5 mg by mouth 2 (two) times daily.   Yes [provider]  mirtazapine (REMERON) 30 MG tablet Take 30 mg by mouth at bedtime.   Yes [provider]  Multiple Vitamin (MULTIVITAMIN WITH MINERALS) TABS tablet Take 1  tablet by mouth daily.   Yes [provider]  omeprazole (PRILOSEC) 20 MG capsule Take 20 mg by mouth 2 (two) times daily.   Yes [provider]  promethazine (PHENERGAN) 25 MG tablet Take 1 tablet (25 mg total) by mouth every 6 (six) hours as needed for nausea. 02/26/17  Yes Tarry Kos, MD  sildenafil (VIAGRA) 100 MG tablet Take 1 tablet by mouth daily as needed for erectile dysfunction. 10/10/17  Yes [provider]  oxyCODONE (OXY IR/ROXICODONE) 5 MG immediate release tablet Take 1-3 tablets (5-15 mg total) by mouth every 4 (four) hours as needed. 02/26/17   Tarry Kos, MD  oxyCODONE-acetaminophen (PERCOCET/ROXICET) 5-325 MG tablet Take 2 tablets by mouth every 8 (eight) hours as needed for severe pain. 03/11/17   Tarry Kos, MD    Family History History reviewed. No pertinent family history.  Social History Social History   Tobacco Use  . Smoking status: Current Some Day Smoker    Packs/day: 1.00    Years: 35.00    Pack years: 35.00    Types: Cigarettes  . Smokeless tobacco: Never Used  Substance Use Topics  . Alcohol use: Yes    Comment: 3-4 times weekly heavily  . Drug use: No     Allergies   No known allergies   Review of Systems Review of Systems  Constitutional: Negative for chills, fatigue and fever.  HENT: Negative for sore throat and trouble swallowing.   Eyes: Negative for visual disturbance.  Respiratory: Negative for cough, shortness of breath and wheezing.   Cardiovascular: Negative for chest pain, palpitations and leg swelling.  Gastrointestinal: Negative for abdominal pain, blood in stool, nausea and vomiting.  Genitourinary: Negative for dysuria, flank pain and hematuria.  Musculoskeletal: Negative for arthralgias, back pain, myalgias, neck pain and neck stiffness.  Skin: Negative for rash.  Neurological: Negative for dizziness, weakness, numbness and headaches.  Hematological: Does not bruise/bleed easily.    Psychiatric/Behavioral: Negative for confusion and suicidal ideas. The patient is nervous/anxious.      Physical Exam Updated Vital Signs BP (!) 171/109   Pulse (!) 58   Temp 98 F (36.7 C) (Oral)   Resp 10   Ht 5\' 9"  (1.753 m)   Wt 77.1 kg (170 lb)   SpO2 100%   BMI 25.10 kg/m   Physical Exam  Constitutional: He is oriented to person, place, and time. He appears well-developed and well-nourished. No distress.  HENT:  Head: Normocephalic.  Mouth/Throat: Oropharynx is clear and moist.  Eyes: EOM are normal. Pupils are equal, round, and reactive to light.  Neck: Normal range of motion. Neck supple. No JVD present. No Kernig's sign noted. No thyromegaly present.  Cardiovascular: Normal rate, regular rhythm, normal heart sounds  and intact distal pulses.  Pulmonary/Chest: Effort normal and breath sounds normal. He has no wheezes. He exhibits no tenderness.  Abdominal: Soft. Normal appearance. There is no tenderness. There is no rebound and no guarding.  Musculoskeletal: Normal range of motion. He exhibits no edema.  Neurological: He is alert and oriented to person, place, and time. He has normal strength. No sensory deficit. Gait normal. GCS eye subscore is 4. GCS verbal subscore is 5. GCS motor subscore is 6.  CN II-XII intact.    Skin: Skin is warm and dry. Capillary refill takes less than 2 seconds. No rash noted.  Psychiatric: He has a normal mood and affect. His speech is normal and behavior is normal. He expresses no homicidal and no suicidal ideation. He expresses no suicidal plans and no homicidal plans.  Nursing note and vitals reviewed.    ED Treatments / Results  Labs (all labs ordered are listed, but only abnormal results are displayed) Labs Reviewed  CBC WITH DIFFERENTIAL/PLATELET - Abnormal; Notable for the following components:      Result Value   Hemoglobin 12.6 (*)    All other components within normal limits  BASIC METABOLIC PANEL - Abnormal; Notable for the  following components:   Glucose, Bld 100 (*)    All other components within normal limits  TROPONIN I    EKG  EKG Interpretation  Date/Time:  Thursday January 15 2018 10:30:17 EDT Ventricular Rate:  67 PR Interval:    QRS Duration: 77 QT Interval:  398 QTC Calculation: 421 R Axis:   40 Text Interpretation:  Sinus rhythm Abnormal R-wave progression, early transition Baseline wander in lead(s) V4 No STEMI.  Confirmed by Alona Bene [phone]) on 01/16/2018 1:50:19 PM       Radiology Dg Chest 2 View  Result Date: 01/15/2018 CLINICAL DATA:  High blood pressure for the past few weeks EXAM: CHEST - 2 VIEW COMPARISON:  November 25, 2006 FINDINGS: The heart size and mediastinal contours are within normal limits. Both lungs are clear. The visualized skeletal structures are unremarkable. IMPRESSION: No active cardiopulmonary disease. Electronically Signed   By: Sherian Rein M.D.   On: 01/15/2018 10:33     Procedures Procedures (including critical care time)  Medications Ordered in ED Medications  metoprolol tartrate (LOPRESSOR) injection 5 mg (5 mg Intravenous Given 01/15/18 1155)  metoprolol tartrate (LOPRESSOR) 5 MG/5ML injection (5 mg  Given 01/15/18 1259)     Initial Impression / Assessment and Plan / ED Course  I have reviewed the triage vital signs and the nursing notes.  Pertinent labs & imaging results that were available during my care of the patient were reviewed by me and considered in my medical decision making (see chart for details).     Patient with history of hypertension, medications recently changed.  Will have patient increase metoprolol dose.  He appears stable for discharge home.  Return precautions discussed.  He will follow-up with his PCP at the [place].  Final Clinical Impressions(s) / ED Diagnoses   Final diagnoses:  Essential hypertension    ED Discharge Orders    None       Rosey Bath 01/17/18 2226    Bethann Berkshire, MD 01/18/18 1230

## 2018-02-10 ENCOUNTER — Telehealth (INDEPENDENT_AMBULATORY_CARE_PROVIDER_SITE_OTHER): Payer: Self-pay | Admitting: Orthopaedic Surgery

## 2018-02-10 NOTE — Telephone Encounter (Signed)
Patient called about records request from the [facility] I advised him they requested records 01/26/2017 to 01/28/2017 and that he was not seen during that time frame. Because their release specified dates, there was nothing to send. Patient wanted all his records sent to them. I told him he needs to sign a release so that we can send all of his records as he is wanting. He got upset and disconnected the call

## 2018-03-03 ENCOUNTER — Telehealth (INDEPENDENT_AMBULATORY_CARE_PROVIDER_SITE_OTHER): Payer: Self-pay | Admitting: Orthopaedic Surgery

## 2018-03-03 NOTE — Telephone Encounter (Signed)
Patient called checking status of records going to the [place]. I told him records were mailed 4/15 to the address on his release form he completed. I told him I would mail another copy. I also faxed records to [phone].

## 2018-11-10 ENCOUNTER — Encounter (HOSPITAL_COMMUNITY): Payer: Self-pay

## 2018-11-10 ENCOUNTER — Emergency Department (HOSPITAL_COMMUNITY)
Admission: EM | Admit: 2018-11-10 | Discharge: 2018-11-10 | Disposition: A | Discharge status: Home / Self Care | Payer: No Typology Code available for payment source | Attending: Emergency Medicine | Admitting: Emergency Medicine

## 2018-11-10 ENCOUNTER — Emergency Department (HOSPITAL_COMMUNITY): Payer: No Typology Code available for payment source

## 2018-11-10 ENCOUNTER — Other Ambulatory Visit: Payer: Self-pay

## 2018-11-10 DIAGNOSIS — Y999 Unspecified external cause status: Secondary | ICD-10-CM | POA: Diagnosis not present

## 2018-11-10 DIAGNOSIS — R030 Elevated blood-pressure reading, without diagnosis of hypertension: Secondary | ICD-10-CM

## 2018-11-10 DIAGNOSIS — Z23 Encounter for immunization: Secondary | ICD-10-CM | POA: Insufficient documentation

## 2018-11-10 DIAGNOSIS — Z79899 Other long term (current) drug therapy: Secondary | ICD-10-CM | POA: Insufficient documentation

## 2018-11-10 DIAGNOSIS — I10 Essential (primary) hypertension: Secondary | ICD-10-CM | POA: Diagnosis not present

## 2018-11-10 DIAGNOSIS — F17210 Nicotine dependence, cigarettes, uncomplicated: Secondary | ICD-10-CM | POA: Insufficient documentation

## 2018-11-10 DIAGNOSIS — S61411A Laceration without foreign body of right hand, initial encounter: Secondary | ICD-10-CM | POA: Insufficient documentation

## 2018-11-10 DIAGNOSIS — Y92007 Garden or yard of unspecified non-institutional (private) residence as the place of occurrence of the external cause: Secondary | ICD-10-CM | POA: Insufficient documentation

## 2018-11-10 DIAGNOSIS — W268XXA Contact with other sharp object(s), not elsewhere classified, initial encounter: Secondary | ICD-10-CM | POA: Diagnosis not present

## 2018-11-10 DIAGNOSIS — Y9389 Activity, other specified: Secondary | ICD-10-CM | POA: Insufficient documentation

## 2018-11-10 MED ORDER — BACITRACIN-NEOMYCIN-POLYMYXIN 400-5-5000 EX OINT
TOPICAL_OINTMENT | Freq: Once | CUTANEOUS | Status: DC
  Filled 2018-11-10: qty 2

## 2018-11-10 MED ORDER — BUPIVACAINE HCL (PF) 0.25 % IJ SOLN
INTRAMUSCULAR | Status: AC
  Filled 2018-11-10: qty 30

## 2018-11-10 MED ORDER — TRAMADOL HCL 50 MG PO TABS
100.0000 mg | ORAL_TABLET | Freq: Once | ORAL | Status: AC
  Administered 2018-11-10: 100 mg via ORAL
  Filled 2018-11-10: qty 2

## 2018-11-10 MED ORDER — TETANUS-DIPHTH-ACELL PERTUSSIS 5-2.5-18.5 LF-MCG/0.5 IM SUSP
0.5000 mL | Freq: Once | INTRAMUSCULAR | Status: AC
  Administered 2018-11-10: 0.5 mL via INTRAMUSCULAR
  Filled 2018-11-10: qty 0.5

## 2018-11-10 MED ORDER — PROMETHAZINE HCL 12.5 MG PO TABS
12.5000 mg | ORAL_TABLET | Freq: Once | ORAL | Status: AC
  Administered 2018-11-10: 12.5 mg via ORAL
  Filled 2018-11-10: qty 1

## 2018-11-10 MED ORDER — POVIDONE-IODINE 10 % EX SOLN
CUTANEOUS | Status: AC
  Filled 2018-11-10: qty 15

## 2018-11-10 MED ORDER — DOXYCYCLINE HYCLATE 100 MG PO CAPS: 100.0000 mg | ORAL_CAPSULE | Freq: Two times a day (BID) | ORAL | 0 refills | Status: DC

## 2018-11-10 NOTE — ED Provider Notes (Signed)
[facility] EMERGENCY DEPARTMENT Provider Note   CSN: [id number] Arrival date & time: 11/10/18  1219     History   Chief Complaint Chief Complaint  Patient presents with  . Extremity Laceration    HPI Keith Garza is a 53 y.o. male.  Patient is a 53 year old male who presents to the emergency department with a laceration to the right hand.  The patient states that 2 days ago he stumbled over some equipment in the yard, he felt as though his hip gave out and he fell on a piece of metal and cut his hand.  The patient states that he had been trying to cleanse it and take care of it, but today he got a better look at it and saw how deep it was and came to the emergency department.  Patient also states he is having some increased pain now more so than he had when he sustained the initial laceration.  The patient denies being on any anticoagulation medications.  He denies any recent operations or procedures involving the right upper extremity.  No high fevers reported.  No history of red streaking up the arm.   The history is provided by the patient.    Past Medical History:  Diagnosis Date  . Arthritis   . Bipolar disorder (HCC)   . Cataract    left eye   to have surgery in may 18  . Complication of anesthesia    "quit breathing twice during last surgery"-told might have sleep apnea  . Depression   . GERD (gastroesophageal reflux disease)   . Hypertension   . PTSD (post-traumatic stress disorder)     Patient Active Problem List   Diagnosis Date Noted  . Total knee replacement status 02/26/2017  . Unilateral primary osteoarthritis, right knee 01/23/2017  . Acute gout 02/04/2015  . Cellulitis of left foot 02/03/2015    Past Surgical History:  Procedure Laterality Date  . FOOT SURGERY Left    fx middle toe  . KNEE ARTHROPLASTY Right 2008   medial knee  . REVISION TOTAL KNEE ARTHROPLASTY Right 02/26/2017  . SPERMATOCELECTOMY Left 2009  . TOTAL KNEE REVISION Right  02/26/2017   Procedure: RIGHT TOTAL KNEE REVISION;  Surgeon: Tarry Kos, MD;  Location: [facility] OR;  Service: Orthopedics;  Laterality: Right;        Home Medications    Prior to Admission medications   Medication Sig Start Date End Date Taking? Authorizing Provider  acetaminophen (TYLENOL) 325 MG tablet Take 650 mg by mouth every 6 (six) hours as needed for moderate pain.   Yes [provider]  cetirizine (ZYRTEC) 10 MG tablet Take 10 mg by mouth daily.    Yes [provider]  Cholecalciferol (VITAMIN D PO) Take 4 tablets by mouth daily.   Yes [provider]  divalproex (DEPAKOTE) 500 MG DR tablet Take 500 mg by mouth every evening.    Yes [provider]  fluticasone (FLONASE) 50 MCG/ACT nasal spray Place 2 sprays into both nostrils 2 (two) times daily.    Yes [provider]  gabapentin (NEURONTIN) 300 MG capsule Take 300 mg by mouth daily.    Yes [provider]  ibuprofen (ADVIL,MOTRIN) 200 MG tablet Take 400 mg by mouth every 6 (six) hours as needed for mild pain.   Yes [provider]  lisinopril (PRINIVIL,ZESTRIL) 40 MG tablet Take 20 mg by mouth 2 (two) times daily.    Yes [provider]  lurasidone (  LATUDA) 80 MG TABS tablet Take 80 mg by mouth at bedtime.   Yes [provider]  metoprolol tartrate (LOPRESSOR) 25 MG tablet Take 12.5 mg by mouth 2 (two) times daily.   Yes [provider]  mirtazapine (REMERON) 30 MG tablet Take 30 mg by mouth at bedtime.   Yes [provider]  Multiple Vitamin (MULTIVITAMIN WITH MINERALS) TABS tablet Take 1 tablet by mouth daily.   Yes [provider]  omeprazole (PRILOSEC) 20 MG capsule Take 20 mg by mouth 2 (two) times daily.   Yes [provider]  sildenafil (VIAGRA) 100 MG tablet Take 1 tablet by mouth daily as needed for erectile dysfunction. 10/10/17  Yes [provider]    Family History No family history on  file.  Social History Social History   Tobacco Use  . Smoking status: Current Some Day Smoker    Packs/day: 1.00    Years: 35.00    Pack years: 35.00    Types: Cigarettes  . Smokeless tobacco: Never Used  Substance Use Topics  . Alcohol use: Yes    Comment: 3-4 times weekly heavily- last weekend  . Drug use: No     Allergies   No known allergies   Review of Systems Review of Systems  Constitutional: Negative for activity change.       All ROS Neg except as noted in HPI  HENT: Negative for nosebleeds.   Eyes: Negative for photophobia and discharge.  Respiratory: Negative for cough, shortness of breath and wheezing.   Cardiovascular: Negative for chest pain and palpitations.  Gastrointestinal: Negative for abdominal pain and blood in stool.  Genitourinary: Negative for dysuria, frequency and hematuria.  Musculoskeletal: Positive for arthralgias. Negative for back pain and neck pain.  Skin: Positive for wound.       Right hand pain  Neurological: Negative for dizziness, seizures and speech difficulty.  Psychiatric/Behavioral: Negative for confusion and hallucinations.     Physical Exam Updated Vital Signs BP (!) 167/110 (BP Location: Left Arm)   Pulse 83   Temp 98.2 F (36.8 C) (Oral)   Resp 18   Ht 5\' 9"  (1.753 m)   Wt 74.8 kg   SpO2 100%   BMI 24.37 kg/m   Physical Exam Vitals signs and nursing note reviewed.  Constitutional:      Appearance: He is well-developed. He is not toxic-appearing.  HENT:     Head: Normocephalic.     Right Ear: Tympanic membrane and external ear normal.     Left Ear: Tympanic membrane and external ear normal.  Eyes:     General: Lids are normal.     Pupils: Pupils are equal, round, and reactive to light.  Neck:     Musculoskeletal: Normal range of motion and neck supple.     Vascular: No carotid bruit.  Cardiovascular:     Rate and Rhythm: Normal rate and regular rhythm.     Pulses: Normal pulses.     Heart sounds: Normal  heart sounds.  Pulmonary:     Effort: No respiratory distress.     Breath sounds: Normal breath sounds.  Abdominal:     General: Bowel sounds are normal.     Palpations: Abdomen is soft.     Tenderness: There is no abdominal tenderness. There is no guarding.  Musculoskeletal:     Right hand: He exhibits tenderness and laceration. He exhibits normal range of motion. Normal sensation noted. Normal strength noted.  Hands:     Comments: Good range of motion is noted of right and left upper extremity and right and left lower extremity.  2.4 cm laceration of the palmar ulnar surface of the right hand  Lymphadenopathy:     Head:     Right side of head: No submandibular adenopathy.     Left side of head: No submandibular adenopathy.     Cervical: No cervical adenopathy.  Skin:    General: Skin is warm and dry.  Neurological:     Mental Status: He is alert and oriented to person, place, and time.     Cranial Nerves: No cranial nerve deficit.     Sensory: No sensory deficit.     Comments: Gait is steady.  Psychiatric:        Speech: Speech normal.      ED Treatments / Results  Labs (all labs ordered are listed, but only abnormal results are displayed) Labs Reviewed - No data to display  EKG None  Radiology No results found.  Procedures Procedures (including critical care time)  Medications Ordered in ED Medications  Tdap (BOOSTRIX) injection 0.5 mL (has no administration in time range)  traMADol (ULTRAM) tablet 100 mg (has no administration in time range)  promethazine (PHENERGAN) tablet 12.5 mg (has no administration in time range)     Initial Impression / Assessment and Plan / ED Course  I have reviewed the triage vital signs and the nursing notes.  Pertinent labs & imaging results that were available during my care of the patient were reviewed by me and considered in my medical decision making (see chart for details).       Final Clinical Impressions(s) /  ED Diagnoses MDM  Initial blood pressure is elevated at 167/110.  Pulse oximetry is within normal limits at 100%.  Patient has a 2.4 cm laceration of the palmar ulnar surface of the right hand.  No bone or tendon involvement noted.  The patient has full range of motion of the fingers.  He is able to flex and extend the fingers against resistance.  Patient treated in the emergency department with 100 mg of Ultram for his pain.  X-ray is negative for foreign body, gas, or fracture, or dislocation.  I discussed these findings with the patient.  The patient states that he is still having a great deal of pain, he says that "what ever you gave me is not helping at all".  Digital block performed with 0.25 mg bupivacaine.  Patient had a improvement in the pain.  A Neosporin dressing was applied.  At the time of discharge the patient walked outside to talk with someone, and states that he will return.  The patient is prescribed doxycycline.  I have asked the patient to see the physicians at the [facility] for follow-up of his blood pressure which at 1221 was elevated at 201/124, as well as the laceration of the right hand.  I explained to the patient that his laceration was outside of a safe timeframe for suture repair.  Patient acknowledges understanding of these instructions.   Final diagnoses:  Laceration of right hand without foreign body, initial encounter  Elevated blood pressure reading    ED Discharge Orders         Ordered    doxycycline (VIBRAMYCIN) 100 MG capsule  2 times daily     11/10/18 1307           Ivery Quale, PA-C 11/10/18 1321  Doug Sou, MD 11/10/18 [phone]

## 2018-11-10 NOTE — ED Triage Notes (Signed)
Pt reports r hip gave out 2 days ago and he fell in his driveway.  PT says has laceration to r hand that he has tried to manage at home.

## 2018-11-10 NOTE — Discharge Instructions (Addendum)
The laceration of your hand is beyond the timeframe for suture without risk of increased infection.  Please cleanse the area daily with soap and water..  Do not use peroxide.  Keep the wound clean and dry.  Change the dressing daily.  Please have this followed up by your physicians at the [facility].  Please use doxycycline 2 times daily with food.  Use Tylenol extra strength every 4 hours, or ibuprofen every 6 hours for soreness or discomfort.

## 2019-01-06 ENCOUNTER — Telehealth (INDEPENDENT_AMBULATORY_CARE_PROVIDER_SITE_OTHER): Payer: Self-pay

## 2019-01-06 NOTE — Telephone Encounter (Signed)
Keith Garza with [facility] would like a note faxed for pre-dental Antibiotics. Patient had Right Total Knee, 02/26/2017.   Fax# [phone].  Cb# is [phone].  Please advise. Thank You.

## 2019-01-07 NOTE — Telephone Encounter (Signed)
2 g amox

## 2019-01-07 NOTE — Telephone Encounter (Signed)
Faxed to dentist office. [phone].

## 2019-01-07 NOTE — Telephone Encounter (Signed)
See message.
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
## 2020-02-28 ENCOUNTER — Encounter (HOSPITAL_COMMUNITY): Payer: Self-pay

## 2020-02-28 ENCOUNTER — Other Ambulatory Visit: Payer: Self-pay

## 2020-02-29 ENCOUNTER — Encounter (HOSPITAL_COMMUNITY)
Admission: RE | Admit: 2020-02-29 | Discharge: 2020-02-29 | Disposition: A | Discharge status: Home / Self Care | Payer: No Typology Code available for payment source | Source: Ambulatory Visit | Attending: Ophthalmology | Admitting: Ophthalmology

## 2020-02-29 ENCOUNTER — Other Ambulatory Visit (HOSPITAL_COMMUNITY)
Admission: RE | Admit: 2020-02-29 | Discharge: 2020-02-29 | Disposition: A | Discharge status: Home / Self Care | Payer: No Typology Code available for payment source | Source: Ambulatory Visit | Attending: Ophthalmology | Admitting: Ophthalmology

## 2020-02-29 DIAGNOSIS — Z01812 Encounter for preprocedural laboratory examination: Secondary | ICD-10-CM | POA: Diagnosis present

## 2020-02-29 DIAGNOSIS — Z20822 Contact with and (suspected) exposure to covid-19: Secondary | ICD-10-CM | POA: Insufficient documentation

## 2020-03-01 LAB — SARS CORONAVIRUS 2 (TAT 6-24 HRS): SARS Coronavirus 2: NEGATIVE

## 2020-03-03 ENCOUNTER — Encounter (HOSPITAL_COMMUNITY): Payer: Self-pay | Admitting: Ophthalmology

## 2020-03-03 ENCOUNTER — Encounter (HOSPITAL_COMMUNITY)
Admission: RE | Disposition: A | Discharge status: Home / Self Care | Payer: Self-pay | Source: Home / Self Care | Attending: Ophthalmology

## 2020-03-03 ENCOUNTER — Ambulatory Visit (HOSPITAL_COMMUNITY)
Admission: RE | Admit: 2020-03-03 | Discharge: 2020-03-03 | Disposition: A | Discharge status: Home / Self Care | Payer: No Typology Code available for payment source | Attending: Ophthalmology | Admitting: Ophthalmology

## 2020-03-03 ENCOUNTER — Ambulatory Visit (HOSPITAL_COMMUNITY): Payer: No Typology Code available for payment source | Admitting: Anesthesiology

## 2020-03-03 DIAGNOSIS — F17200 Nicotine dependence, unspecified, uncomplicated: Secondary | ICD-10-CM | POA: Diagnosis not present

## 2020-03-03 DIAGNOSIS — Z79899 Other long term (current) drug therapy: Secondary | ICD-10-CM | POA: Diagnosis not present

## 2020-03-03 DIAGNOSIS — H25811 Combined forms of age-related cataract, right eye: Secondary | ICD-10-CM | POA: Insufficient documentation

## 2020-03-03 DIAGNOSIS — Z96659 Presence of unspecified artificial knee joint: Secondary | ICD-10-CM | POA: Insufficient documentation

## 2020-03-03 DIAGNOSIS — M1990 Unspecified osteoarthritis, unspecified site: Secondary | ICD-10-CM | POA: Insufficient documentation

## 2020-03-03 DIAGNOSIS — F329 Major depressive disorder, single episode, unspecified: Secondary | ICD-10-CM | POA: Insufficient documentation

## 2020-03-03 HISTORY — PX: CATARACT EXTRACTION W/PHACO: SHX586

## 2020-03-03 SURGERY — PHACOEMULSIFICATION, CATARACT, WITH IOL INSERTION
Anesthesia: Monitor Anesthesia Care | Site: Eye | Laterality: Right

## 2020-03-03 MED ORDER — SODIUM HYALURONATE 23 MG/ML IO SOLN
INTRAOCULAR | Status: DC | PRN
  Administered 2020-03-03: 0.6 mL via INTRAOCULAR

## 2020-03-03 MED ORDER — CYCLOPENTOLATE-PHENYLEPHRINE 0.2-1 % OP SOLN
1.0000 [drp] | OPHTHALMIC | Status: AC | PRN
  Administered 2020-03-03 (×3): 1 [drp] via OPHTHALMIC

## 2020-03-03 MED ORDER — BSS IO SOLN
INTRAOCULAR | Status: DC | PRN
  Administered 2020-03-03: 15 mL via INTRAOCULAR

## 2020-03-03 MED ORDER — LIDOCAINE HCL 3.5 % OP GEL
1.0000 "application " | Freq: Once | OPHTHALMIC | Status: AC
  Administered 2020-03-03: 1 via OPHTHALMIC

## 2020-03-03 MED ORDER — POVIDONE-IODINE 5 % OP SOLN
OPHTHALMIC | Status: DC | PRN
  Administered 2020-03-03: 1 via OPHTHALMIC

## 2020-03-03 MED ORDER — MIDAZOLAM HCL 2 MG/2ML IJ SOLN
1.0000 mg | Freq: Once | INTRAMUSCULAR | Status: AC
  Administered 2020-03-03: 1 mg via INTRAVENOUS
  Filled 2020-03-03: qty 2

## 2020-03-03 MED ORDER — NEOMYCIN-POLYMYXIN-DEXAMETH 3.5-10000-0.1 OP SUSP
OPHTHALMIC | Status: DC | PRN
  Administered 2020-03-03: 1 [drp] via OPHTHALMIC

## 2020-03-03 MED ORDER — PROVISC 10 MG/ML IO SOLN
INTRAOCULAR | Status: DC | PRN
  Administered 2020-03-03: 0.85 mL via INTRAOCULAR

## 2020-03-03 MED ORDER — EPINEPHRINE PF 1 MG/ML IJ SOLN
INTRAOCULAR | Status: DC | PRN
  Administered 2020-03-03: 09:00:00 500 mL

## 2020-03-03 MED ORDER — MIDAZOLAM HCL 2 MG/2ML IJ SOLN
INTRAMUSCULAR | Status: AC
  Filled 2020-03-03: qty 2

## 2020-03-03 MED ORDER — MIDAZOLAM HCL 2 MG/2ML IJ SOLN
INTRAMUSCULAR | Status: DC | PRN
  Administered 2020-03-03: 1 mg via INTRAVENOUS

## 2020-03-03 MED ORDER — PHENYLEPHRINE HCL 2.5 % OP SOLN
1.0000 [drp] | OPHTHALMIC | Status: AC | PRN
  Administered 2020-03-03 (×3): 1 [drp] via OPHTHALMIC

## 2020-03-03 MED ORDER — TETRACAINE HCL 0.5 % OP SOLN
1.0000 [drp] | OPHTHALMIC | Status: AC | PRN
  Administered 2020-03-03 (×3): 1 [drp] via OPHTHALMIC

## 2020-03-03 MED ORDER — LIDOCAINE HCL (PF) 1 % IJ SOLN
INTRAOCULAR | Status: DC | PRN
  Administered 2020-03-03: 09:00:00 1 mL via OPHTHALMIC

## 2020-03-03 SURGICAL SUPPLY — 13 items
CLOTH BEACON ORANGE TIMEOUT ST (SAFETY) ×2 IMPLANT
EYE SHIELD UNIVERSAL CLEAR (GAUZE/BANDAGES/DRESSINGS) ×2 IMPLANT
GLOVE BIOGEL PI IND STRL 7.0 (GLOVE) IMPLANT
GLOVE BIOGEL PI INDICATOR 7.0 (GLOVE) ×4
NDL HYPO 18GX1.5 BLUNT FILL (NEEDLE) IMPLANT
NEEDLE HYPO 18GX1.5 BLUNT FILL (NEEDLE) ×3 IMPLANT
PAD ARMBOARD 7.5X6 YLW CONV (MISCELLANEOUS) ×2 IMPLANT
SIGHTPATH CAT PROC W REG LENS (Ophthalmic Related) ×2 IMPLANT
SYR TB 1ML LL NO SAFETY (SYRINGE) ×2 IMPLANT
TAPE SURG TRANSPORE 1 IN (GAUZE/BANDAGES/DRESSINGS) IMPLANT
TAPE SURGICAL TRANSPORE 1 IN (GAUZE/BANDAGES/DRESSINGS) ×3
VISCOELASTIC ADDITIONAL (OPHTHALMIC RELATED) ×2 IMPLANT
WATER STERILE IRR 250ML POUR (IV SOLUTION) ×2 IMPLANT

## 2020-03-03 NOTE — Transfer of Care (Signed)
Immediate Anesthesia Transfer of Care Note  Patient: Keith Garza  Procedure(s) Performed: CATARACT EXTRACTION PHACO AND INTRAOCULAR LENS PLACEMENT RIGHT EYE (Right Eye)  Patient Location: Short Stay  Anesthesia Type:MAC  Level of Consciousness: awake, alert , oriented and patient cooperative  Airway & Oxygen Therapy: Patient Spontanous Breathing  Post-op Assessment: Report given to RN and Post -op Vital signs reviewed and stable  Post vital signs: Reviewed and stable  Last Vitals:  Vitals Value Taken Time  BP    Temp    Pulse    Resp    SpO2      Last Pain:  Vitals:   03/03/20 0715  PainSc: 0-No pain         Complications: No apparent anesthesia complications

## 2020-03-03 NOTE — Anesthesia Postprocedure Evaluation (Signed)
Anesthesia Post Note  Patient: Keith Garza  Procedure(s) Performed: CATARACT EXTRACTION PHACO AND INTRAOCULAR LENS PLACEMENT RIGHT EYE (Right Eye)  Patient location during evaluation: Short Stay Anesthesia Type: MAC Level of consciousness: awake and alert Pain management: pain level controlled Vital Signs Assessment: post-procedure vital signs reviewed and stable Respiratory status: spontaneous breathing Cardiovascular status: stable Postop Assessment: no apparent nausea or vomiting Anesthetic complications: no     Last Vitals:  Vitals:   03/03/20 0730 03/03/20 0800  BP:  (!) 180/100  Pulse: 66 72  Resp: 18 18  Temp:    SpO2: 100% 100%    Last Pain:  Vitals:   03/03/20 0715  PainSc: 0-No pain                 [name]

## 2020-03-03 NOTE — Interval H&P Note (Signed)
History and Physical Interval Note: The H and P was reviewed and updated. The patient was examined.  No changes were found after exam.  The surgical eye was marked.  03/03/2020 8:25 AM  Keith Garza  has presented today for surgery, with the diagnosis of Nuclear sclerotic cataract - Right eye.  The various methods of treatment have been discussed with the patient and family. After consideration of risks, benefits and other options for treatment, the patient has consented to  Procedure(s) with comments: CATARACT EXTRACTION PHACO AND INTRAOCULAR LENS PLACEMENT RIGHT EYE (Right) - right as a surgical intervention.  The patient's history has been reviewed, patient examined, no change in status, stable for surgery.  I have reviewed the patient's chart and labs.  Questions were answered to the patient's satisfaction.     Fabio Pierce

## 2020-03-03 NOTE — Discharge Instructions (Signed)
Please discharge patient when stable, will follow up today with Dr. Senan Urey at the [facility] [place] office immediately following discharge.  Leave shield in place until visit.  All paperwork with discharge instructions will be given at the office.  [facility] Address:  [address]  [place], [place] [address]  

## 2020-03-03 NOTE — Op Note (Signed)
Date of procedure: 03/03/20  Pre-operative diagnosis:  Visually significant combined form age-related cataract, Right Eye (H25.811)  Post-operative diagnosis:  Visually significant combined form age-related cataract, Right Eye (H25.811)  Procedure: Removal of cataract via phacoemulsification and insertion of intra-ocular lens Alcon [id number]  +19.0D into the capsular bag of the Right Eye  Attending surgeon: Gerda Diss. Jermiya Reichl, MD, MA  Anesthesia: MAC, Topical Akten  Complications: None  Estimated Blood Loss: <65m (minimal)  Specimens: None  Implants: As above  Indications:  Visually significant age-related cataract, Right Eye  Procedure:  The patient was seen and identified in the pre-operative area. The operative eye was identified and dilated.  The operative eye was marked.  Topical anesthesia was administered to the operative eye.     The patient was then to the operative suite and placed in the supine position.  A timeout was performed confirming the patient, procedure to be performed, and all other relevant information.   The patient's face was prepped and draped in the usual fashion for intra-ocular surgery.  A lid speculum was placed into the operative eye and the surgical microscope moved into place and focused.  A superotemporal paracentesis was created using a 20 gauge paracentesis blade.  Shugarcaine was injected into the anterior chamber.  Viscoelastic was injected into the anterior chamber.  A temporal clear-corneal main wound incision was created using a 2.476mmicrokeratome.  A continuous curvilinear capsulorrhexis was initiated using an irrigating cystitome and completed using capsulorrhexis forceps.  Hydrodissection and hydrodeliniation were performed.  Viscoelastic was injected into the anterior chamber.  A phacoemulsification handpiece and a chopper as a second instrument were used to remove the nucleus and epinucleus. The irrigation/aspiration handpiece was used to remove any  remaining cortical material.   The capsular bag was reinflated with viscoelastic, checked, and found to be intact.  The intraocular lens was inserted into the capsular bag.  The irrigation/aspiration handpiece was used to remove any remaining viscoelastic.  The clear corneal wound and paracentesis wounds were then hydrated and checked with Weck-Cels to be watertight.  The lid-speculum was removed.  The drape was removed.  The patient's face was cleaned with a wet and dry 4x4.   Maxitrol was instilled in the eye before a clear shield was taped over the eye. The patient was taken to the post-operative care unit in good condition, having tolerated the procedure well.  Post-Op Instructions: [facility] and will receive all other orders and instructions.

## 2020-03-03 NOTE — Anesthesia Preprocedure Evaluation (Addendum)
Anesthesia Evaluation  Patient identified by MRN, date of birth, ID band Patient awake    Reviewed: Allergy & Precautions, NPO status , Patient's Chart, lab work & pertinent test results  History of Anesthesia Complications (+) history of anesthetic complications ("quit breathing twice during last surgery"-told might have sleep apnea)  Airway Mallampati: III  TM Distance: >3 FB Neck ROM: Full    Dental  (+) Missing   Pulmonary sleep apnea ("quit breathing twice during last surgery"-told might have sleep apnea) , Current SmokerPatient did not abstain from smoking.,  "quit breathing twice during last surgery"-told might have sleep apnea   Pulmonary exam normal breath sounds clear to auscultation       Cardiovascular Exercise Tolerance: Good hypertension, Pt. on medications  Rhythm:Regular Rate:Normal     Neuro/Psych PSYCHIATRIC DISORDERS Anxiety Depression Bipolar Disorder    GI/Hepatic Neg liver ROS, GERD  Medicated and Controlled,  Endo/Other  negative endocrine ROS  Renal/GU negative Renal ROS  negative genitourinary   Musculoskeletal  (+) Arthritis ,   Abdominal   Peds negative pediatric ROS (+)  Hematology negative hematology ROS (+)   Anesthesia Other Findings   Reproductive/Obstetrics                            Anesthesia Physical Anesthesia Plan  ASA: III  Anesthesia Plan: MAC   Post-op Pain Management:    Induction:   PONV Risk Score and Plan: Treatment may vary due to age or medical condition  Airway Management Planned: Nasal Cannula and Natural Airway  Additional Equipment:   Intra-op Plan:   Post-operative Plan:   Informed Consent: I have reviewed the patients History and Physical, chart, labs and discussed the procedure including the risks, benefits and alternatives for the proposed anesthesia with the patient or authorized representative who has indicated his/her  understanding and acceptance.     Dental advisory given  Plan Discussed with: CRNA and Surgeon  Anesthesia Plan Comments:        Anesthesia Quick Evaluation

## 2021-08-20 ENCOUNTER — Encounter: Payer: Self-pay | Admitting: Internal Medicine

## 2022-01-09 ENCOUNTER — Ambulatory Visit: Payer: No Typology Code available for payment source | Admitting: Gastroenterology

## 2022-03-06 ENCOUNTER — Ambulatory Visit: Payer: No Typology Code available for payment source | Admitting: Gastroenterology

## 2022-05-08 ENCOUNTER — Ambulatory Visit: Payer: No Typology Code available for payment source | Admitting: Gastroenterology

## 2022-07-25 ENCOUNTER — Ambulatory Visit: Payer: No Typology Code available for payment source | Admitting: Orthopaedic Surgery

## 2022-08-20 ENCOUNTER — Ambulatory Visit: Payer: No Typology Code available for payment source | Admitting: Orthopaedic Surgery

## 2024-08-04 DEATH — deceased
